# Patient Record
Sex: Male | Born: 1945 | Race: White | Hispanic: No | Marital: Married | Smoking: Former smoker
Health system: Southern US, Community
[De-identification: ages and names within clinical notes are randomized; demographics above are authoritative.]

## PROBLEM LIST (undated history)

## (undated) DIAGNOSIS — N529 Male erectile dysfunction, unspecified: Secondary | ICD-10-CM

## (undated) DIAGNOSIS — I10 Essential (primary) hypertension: Secondary | ICD-10-CM

## (undated) DIAGNOSIS — E669 Obesity, unspecified: Secondary | ICD-10-CM

## (undated) DIAGNOSIS — I700 Atherosclerosis of aorta: Secondary | ICD-10-CM

## (undated) DIAGNOSIS — R001 Bradycardia, unspecified: Secondary | ICD-10-CM

## (undated) HISTORY — DX: Atherosclerosis of aorta: I70.0

## (undated) HISTORY — DX: Obesity, unspecified: E66.9

## (undated) HISTORY — DX: Essential (primary) hypertension: I10

## (undated) HISTORY — DX: Bradycardia, unspecified: R00.1

## (undated) HISTORY — PX: FRACTURE SURGERY: SHX138

## (undated) HISTORY — DX: Male erectile dysfunction, unspecified: N52.9

## (undated) HISTORY — PX: TONSILLECTOMY: SUR1361

## (undated) HISTORY — PX: HERNIA REPAIR: SHX51

---

## 2002-06-02 HISTORY — PX: GASTRIC BYPASS: SHX52

## 2010-04-04 ENCOUNTER — Ambulatory Visit: Payer: Self-pay | Admitting: Cardiovascular Disease

## 2010-04-15 ENCOUNTER — Ambulatory Visit: Payer: Self-pay | Admitting: Cardiovascular Disease

## 2010-04-22 ENCOUNTER — Telehealth: Payer: Self-pay | Admitting: Cardiovascular Disease

## 2010-04-24 ENCOUNTER — Telehealth: Payer: Self-pay | Admitting: Cardiovascular Disease

## 2010-04-29 ENCOUNTER — Telehealth: Payer: Self-pay | Admitting: Cardiovascular Disease

## 2010-04-30 ENCOUNTER — Telehealth: Payer: Self-pay | Admitting: Cardiovascular Disease

## 2010-05-03 ENCOUNTER — Telehealth: Payer: Self-pay | Admitting: Cardiovascular Disease

## 2010-07-02 NOTE — Progress Notes (Signed)
Summary: BP readings  Phone Note Outgoing Call   Call placed by: Dessie Coma  LPN,  April 24, 2010 8:56 AM Call placed to: Patient Summary of Call: Patient notified per Dr. Kirke Corin, BP readings are much better.  To continue same medications and f/u as planned.

## 2010-07-02 NOTE — Progress Notes (Signed)
Summary: lab results.  Phone Note Outgoing Call   Call placed by: Dessie Coma  LPN,  April 29, 2010 4:06 PM Call placed to: Patient Summary of Call: Patient notified by Dr. Kirke Corin, [facility] was abnormal.  To repeat BMP at PCP.

## 2010-07-02 NOTE — Progress Notes (Signed)
Summary: bp list  Phone Note Outgoing Call   Call placed by: Dessie Coma  LPN,  April 30, 2010 2:42 PM Call placed to: Patient Summary of Call: Patient notified per Dr. Kirke Corin, list of BPs brought in by patient are the same as before and to continue current meds.

## 2010-07-02 NOTE — Progress Notes (Signed)
Summary: lab results  Phone Note Outgoing Call   Call placed by: Dessie Coma  LPN,  May 03, 2010 8:59 AM Call placed to: Patient Summary of Call: Patient notified per Dr. Kirke Corin, kidney function is better but still not back to normal.  To decrease Chlorthalidone to 1/2 tablet once daily.

## 2010-07-02 NOTE — Progress Notes (Signed)
Summary: BP readings  Phone Note Call from Patient   Caller: Patient Summary of Call: Patient stopped by office to have BP checked-BP 131/74  HR 53.  On patient's machine, BP 143/87.  Home BP readings range from 94/55 to 155/83. Initial call taken by: Dessie Coma  LPN,  April 22, 2010 3:37 PM

## 2010-07-16 ENCOUNTER — Ambulatory Visit (INDEPENDENT_AMBULATORY_CARE_PROVIDER_SITE_OTHER): Payer: Medicare PPO | Admitting: Cardiovascular Disease

## 2010-07-16 DIAGNOSIS — I10 Essential (primary) hypertension: Secondary | ICD-10-CM

## 2010-07-16 DIAGNOSIS — I498 Other specified cardiac arrhythmias: Secondary | ICD-10-CM

## 2010-07-17 NOTE — Assessment & Plan Note (Signed)
[facility]                       [place] CARDIOLOGY OFFICE NOTE  Joshua Valenzuela                        MRN:          [id number] DATE:07/16/2010                            DOB:          [birth year]   HISTORY OF PRESENT ILLNESS:  Mr. Joshua Valenzuela is a 65 year old gentleman who is here today for a followup visit.  He has the following problem list: 1. Refractory hypertension. 2. Asymptomatic sinus bradycardia. 3. Erectile dysfunction. 4. Obesity status post gastric bypass surgery.  INTERVAL HISTORY:  The patient overall has been doing very well.  During his last visit, I started him on chlorthalidone 25 mg once daily for blood pressure control.  His labs showed worsening renal function with a creatinine of 1.7.  Thus, I decreased the dose to half a tablet once daily and subsequently his creatinine improved to 1.47.  His lisinopril was also decreased the 20 mg daily from 40 mg.  His blood pressure has improved significantly since then.  He did have few readings of systolic blood pressure of less than 100.  Thus, his dose of terazosin was decreased from 5 mg to 2 mg once daily.  He brought his home blood pressure readings and they are actually mostly in the optimal range. There are only very few readings of systolic blood pressure above 462, mostly his systolic blood pressures are between 110 and 130.  He has not had any dizziness, there are no other cardiac symptoms.  MEDICATIONS: 1. Multivitamin once daily. 2. Fish oil 1000 mg once daily. 3. Lisinopril 20 mg once daily. 4. Amlodipine 10 mg once daily. 5. Chlorthalidone 25 mg half a tablet once daily. 6. Terazosin 2 mg once daily.  ALLERGIES:  No known drug allergies.  PHYSICAL EXAMINATION:  VITAL SIGNS:  Weight is 197.6 pounds, blood pressure is 149/78, pulse is 43, and oxygen saturation is 96% on room air. NECK:  No JVD or carotid bruits. LUNGS:  Clear to auscultation. HEART:  Regular rate and  rhythm with no gallops.  There is a 2/6 holosystolic murmur at the left sternal border. ABDOMEN:  Benign, nontender, and nondistended. EXTREMITIES:  No clubbing, cyanosis, or edema.  IMPRESSION: 1. Hypertension:  His blood pressure has actually been much better     controlled after the recent changes in his medications.  His home     blood pressure readings are in the optimal range.  Thus, we will     continue with current medications.  I will like to obtain a     followup basic metabolic profile within the next few weeks.  He can     have that done at Dr. Blair Dolphin office during his next visit with     him.  Request was given in that regard. 2. Asymptomatic sinus bradycardia.  This can be monitored for now. 3. Mild exertional dyspnea with negative cardiac workup including a     negative treadmill stress test as well as an echocardiogram. The patient can follow up as needed.    Joshua Bears, MD Electronically Signed   MA/MedQ  DD: 07/16/2010  DT: 07/17/2010  Job #: [id number]

## 2010-10-15 NOTE — Letter (Signed)
April 04, 2010    Joshua Valenzuela, M.D.  [facility]  [address]  [place], [place]  [address]   RE:  Joshua Valenzuela, Joshua Valenzuela  MRN:  [id number]  /  DOB:  [birth year]   Dear Dr. Orvan Falconer:   Thank you for referring Joshua Valenzuela for further cardiac evaluation.  As  you are aware, this is a pleasant 65 year old gentleman with the  following problem list:  1. Refractory hypertension.  2. Erectile dysfunction.  3. Obesity status post gastric bypass surgery.   CLINICAL HISTORY:  The patient is here today for cardiac evaluation to  see if it is safe to prescribe him an erectile dysfunction medication.  Overall, he has no previous cardiac history.  He denies any chest pain.  He has mild dyspnea but only at very high workloads.  There has been no  palpitations, syncope or presyncope.  According to the patient his heart  rate is always slow but he does not seem to be symptomatic.  There is no  dizziness.  His blood pressure has been difficult to control.  He is  currently not on a diuretic given that hydrochlorothiazide interfered  with his sexual performance.  Most recently the patient was switched to  [facility] but he has not noticed any difference in his blood pressure  control.  He has been told in the past that he has a heart murmur.   PAST MEDICAL HISTORY:  As outlined above.   MEDICATIONS:  1. Lisinopril 40 mg once daily.  2. Terazosin 5 mg once daily.  3. Tekamlo 300/10 mg once daily.  4. Multivitamin once daily.  5. Fish oil 1000 mg once daily.   ALLERGIES:  No known drug allergies.   SOCIAL HISTORY:  Negative for smoking.  He quit in 1977.  He denies any  alcohol or recreational drug use.  He drinks one cup of coffee a day.  He exercises regularly.  He is retired.   PAST SURGICAL HISTORY:  Includes gastric bypass surgery in 2004 and  hernia surgery.   FAMILY HISTORY:  Negative for premature coronary artery disease.   REVIEW OF SYSTEMS:   Remarkable for mild dyspnea, erectile dysfunction,  occasional fatigue.  A full review of system was performed and is  otherwise negative.   PHYSICAL EXAMINATION:  GENERAL:  The patient is pleasant and in no acute  distress.  VITAL SIGNS:  Weight is 190.8 pounds, blood pressure is 170/76, pulse is  45, oxygen saturation is 96% on room air.  HEENT:  Normocephalic, atraumatic.  NECK:  No JVD or carotid bruits.  RESPIRATORY:  Normal respiratory effort with no use of accessory  muscles.  Auscultation reveals normal breath sounds.  CARDIOVASCULAR:  Normal PMI.  Normal S1, S2 with no gallops.  There is a  2/6 holosystolic murmur at the apex which radiates to the left sternal  border.  ABDOMEN:  Benign, nontender, nondistended with no bruits.  EXTREMITIES:  With no clubbing, cyanosis or edema.  SKIN:  Warm and dry with no rash.  MUSCULOSKELETAL:  Normal muscle strength in the upper and lower  extremities.  PSYCHIATRIC:  He is alert, oriented x3 with normal mood and affect.   An electrocardiogram was performed which showed sinus bradycardia with a  heart rate of 45.  There was no AV block or bundle branch blocks.  QT  interval is normal.  No significant ST or T-wave changes.   IMPRESSION:  1. Mild exertional dyspnea  with history of erectile dysfunction:  We      will need to evaluate his cardiac status before prescribing      erectile dysfunction medications.  The patient does not have any      chest pain and no previous cardiac history.  We will request a      treadmill stress test for further evaluation.  2. Sinus bradycardia:  This does not seem to be symptomatic.  We will      avoid medications that can cause bradycardia.  3. Heart murmur at the apex:  We will request an echocardiogram for      further evaluation.  4. Refractory hypertension:  I asked him to start recording his blood      pressure twice daily and bring it during his follow-up.  According      to him he was evaluated  for renal artery stenosis in 2004 and at      that time it was unremarkable.  We will consider adding hydralazine      upon follow-up if blood pressure remains elevated.   Thank you for allowing me to participate in the care of your patient.    Sincerely,      Lorine Bears, MD  Electronically Signed    MA/MedQ  DD: 04/04/2010  DT: 04/04/2010  Job #: [id number]

## 2010-10-15 NOTE — Assessment & Plan Note (Signed)
[facility]                        [place] CARDIOLOGY OFFICE NOTE   CHRISTON, PARADA                        MRN:          [id number]  DATE:04/15/2010                            DOB:          07-31-1945    Joshua Valenzuela is a 65 year old gentleman who is here today for a followup  visit.  He has the following problem list:  1. Refractory hypertension.  2. Erectile dysfunction.  3. Obesity status post gastric bypass surgery.   INTERVAL HISTORY:  I evaluated Mr. Kennan recently for very mild  exertional dyspnea with baseline bradycardia.  He did not have any chest  pain.  He does have history of erectile dysfunction.  He also has a  prolonged history of refractory hypertension.  He was recently switched  to [facility].  However, he had noticed that there has been no improvement  in his blood pressure with this medication over amlodipine.  He was on  hydrochlorothiazide in the past which was stopped due to interference  with his sexual performance.   MEDICATIONS:  1. Lisinopril 40 mg once daily.  2. Terazosin 5 mg once daily.  3. Tekamlo 300/10 mg once daily.  4. Multivitamin once daily.  5. Fish oil 1000 mg once daily.   PHYSICAL EXAMINATION:  VITAL SIGNS:  Weight is 192.6 pounds, blood  pressure is 181/86, pulse is 45, oxygen saturation is 99% on room air.  NECK:  Reveals no JVD or carotid bruits.  LUNGS:  Clear to auscultation.  HEART:  Regular rate and rhythm with no gallops.  There is a 2/6  holosystolic murmur at the apex and left sternal border.  ABDOMEN:  Benign, nontender, nondistended.  EXTREMITIES:  With no clubbing, cyanosis, or edema.   IMPRESSION:  1. Mild exertional dyspnea.  He underwent a treadmill stress test      which overall was unremarkable.  He was able to exercise for 12      minutes, then achieved maximum heart rate of 146 beats per minute      which is 94% of predicted.  He had no chest pain or EKG changes at      that  workload.  Thus, the patient does not seem to have any      underlying ischemic heart disease.  There is no contraindication to      use PDE inhibitors such as Viagra or Cialis.  2. Sinus bradycardia:  This does not seem to be symptomatic.  His      heart rate increased appropriately with exercise.  There is no      indication for permanent pacemaker.  3. Heart murmur seems to be due to mild mitral regurgitation.      Ejection fraction was normal by echocardiogram.  No further workup      for this is needed.  4. Refractory hypertension:  His blood pressure continues to be      elevated.  I reviewed his home blood pressure readings which seems      to be better than his office readings, but still he gets occasional  reading of systolic of 160.  The patient was evaluated for renal      artery stenosis in the past which was negative.  He does not seem      to have symptoms suggestive of sleep apnea at this time.  He has      not seen any improvement after he was switched to [facility] and is      concerned about the ability to obtain this medication on a regular      basis.  Thus, I will go ahead and switch him back to amlodipine 10      mg once daily.  I discussed with him the next step for blood      pressure control.  I favor starting him on diuretic.  He did not      tolerate hydrochlorothiazide in the past as it interfered with his      sexual performance.  I suggest trying chlorthalidone at 25 mg once      daily which will be started today.  If he does not tolerate this      medication or it does not control his blood pressure, then the next      step would be adding hydralazine.  Unfortunately, we cannot add a      beta blocker due to his baseline bradycardia.  He will continue to      record his home blood pressure readings.  He will return for nurse      visit in a week to measure his blood pressure and compare it with      his home blood pressure machine as well.  We will request  basic      metabolic profile in 1 week.  He will follow up in 3 months from      now or earlier if needed.     Lorine Bears, MD  Electronically Signed    MA/MedQ  DD: 04/15/2010  DT: 04/16/2010  Job #: [id number]

## 2010-12-30 ENCOUNTER — Encounter: Payer: Self-pay | Admitting: Cardiovascular Disease

## 2011-02-23 ENCOUNTER — Encounter: Payer: Self-pay | Admitting: Cardiovascular Disease

## 2011-03-18 ENCOUNTER — Encounter: Payer: Self-pay | Admitting: Cardiovascular Disease

## 2011-12-03 ENCOUNTER — Ambulatory Visit: Payer: Medicare PPO | Admitting: Cardiovascular Disease

## 2012-11-10 ENCOUNTER — Emergency Department (HOSPITAL_COMMUNITY): Payer: Medicare PPO

## 2012-11-10 ENCOUNTER — Encounter (HOSPITAL_COMMUNITY): Payer: Self-pay | Admitting: Emergency Medicine

## 2012-11-10 ENCOUNTER — Emergency Department (HOSPITAL_COMMUNITY)
Admission: EM | Admit: 2012-11-10 | Discharge: 2012-11-10 | Disposition: A | Discharge status: Home / Self Care | Payer: Medicare PPO | Attending: Emergency Medicine | Admitting: Emergency Medicine

## 2012-11-10 DIAGNOSIS — IMO0002 Reserved for concepts with insufficient information to code with codable children: Secondary | ICD-10-CM | POA: Insufficient documentation

## 2012-11-10 DIAGNOSIS — S4980XA Other specified injuries of shoulder and upper arm, unspecified arm, initial encounter: Secondary | ICD-10-CM | POA: Insufficient documentation

## 2012-11-10 DIAGNOSIS — S46909A Unspecified injury of unspecified muscle, fascia and tendon at shoulder and upper arm level, unspecified arm, initial encounter: Secondary | ICD-10-CM | POA: Insufficient documentation

## 2012-11-10 DIAGNOSIS — S0180XA Unspecified open wound of other part of head, initial encounter: Secondary | ICD-10-CM | POA: Insufficient documentation

## 2012-11-10 DIAGNOSIS — S0181XA Laceration without foreign body of other part of head, initial encounter: Secondary | ICD-10-CM

## 2012-11-10 DIAGNOSIS — Z9884 Bariatric surgery status: Secondary | ICD-10-CM | POA: Insufficient documentation

## 2012-11-10 DIAGNOSIS — S52599A Other fractures of lower end of unspecified radius, initial encounter for closed fracture: Secondary | ICD-10-CM | POA: Insufficient documentation

## 2012-11-10 DIAGNOSIS — Z87891 Personal history of nicotine dependence: Secondary | ICD-10-CM | POA: Insufficient documentation

## 2012-11-10 DIAGNOSIS — Z87448 Personal history of other diseases of urinary system: Secondary | ICD-10-CM | POA: Insufficient documentation

## 2012-11-10 DIAGNOSIS — Z79899 Other long term (current) drug therapy: Secondary | ICD-10-CM | POA: Insufficient documentation

## 2012-11-10 DIAGNOSIS — S2249XA Multiple fractures of ribs, unspecified side, initial encounter for closed fracture: Secondary | ICD-10-CM | POA: Insufficient documentation

## 2012-11-10 DIAGNOSIS — R109 Unspecified abdominal pain: Secondary | ICD-10-CM | POA: Insufficient documentation

## 2012-11-10 DIAGNOSIS — Y9389 Activity, other specified: Secondary | ICD-10-CM | POA: Insufficient documentation

## 2012-11-10 DIAGNOSIS — I10 Essential (primary) hypertension: Secondary | ICD-10-CM | POA: Insufficient documentation

## 2012-11-10 DIAGNOSIS — Z8679 Personal history of other diseases of the circulatory system: Secondary | ICD-10-CM | POA: Insufficient documentation

## 2012-11-10 DIAGNOSIS — Y9289 Other specified places as the place of occurrence of the external cause: Secondary | ICD-10-CM | POA: Insufficient documentation

## 2012-11-10 DIAGNOSIS — S20219A Contusion of unspecified front wall of thorax, initial encounter: Secondary | ICD-10-CM | POA: Insufficient documentation

## 2012-11-10 DIAGNOSIS — S2241XA Multiple fractures of ribs, right side, initial encounter for closed fracture: Secondary | ICD-10-CM

## 2012-11-10 DIAGNOSIS — S52501A Unspecified fracture of the lower end of right radius, initial encounter for closed fracture: Secondary | ICD-10-CM

## 2012-11-10 LAB — CBC WITH DIFFERENTIAL/PLATELET
Basophils Relative: 0 % (ref 0–1)
Eosinophils Absolute: 0.1 10*3/uL (ref 0.0–0.7)
Hemoglobin: 12.3 g/dL — ABNORMAL LOW (ref 13.0–17.0)
Lymphs Abs: 2.3 10*3/uL (ref 0.7–4.0)
MCH: 29.1 pg (ref 26.0–34.0)
MCHC: 33.4 g/dL (ref 30.0–36.0)
Monocytes Relative: 8 % (ref 3–12)
Neutro Abs: 8.1 10*3/uL — ABNORMAL HIGH (ref 1.7–7.7)
Neutrophils Relative %: 71 % (ref 43–77)
Platelets: 301 10*3/uL (ref 150–400)
RBC: 4.23 MIL/uL (ref 4.22–5.81)

## 2012-11-10 LAB — POCT I-STAT, CHEM 8
Chloride: 109 mEq/L (ref 96–112)
HCT: 38 % — ABNORMAL LOW (ref 39.0–52.0)
Hemoglobin: 12.9 g/dL — ABNORMAL LOW (ref 13.0–17.0)
Potassium: 4.5 mEq/L (ref 3.5–5.1)
Sodium: 140 mEq/L (ref 135–145)

## 2012-11-10 MED ORDER — NAPROXEN 500 MG PO TABS: 500.0000 mg | ORAL_TABLET | Freq: Two times a day (BID) | ORAL | Status: DC

## 2012-11-10 MED ORDER — HYDROCODONE-ACETAMINOPHEN 5-325 MG PO TABS: 2.0000 | ORAL_TABLET | ORAL | Status: DC | PRN

## 2012-11-10 MED ORDER — IOHEXOL 300 MG/ML  SOLN
100.0000 mL | Freq: Once | INTRAMUSCULAR | Status: AC | PRN
  Administered 2012-11-10: 100 mL via INTRAVENOUS

## 2012-11-10 MED ORDER — MORPHINE SULFATE 4 MG/ML IJ SOLN
2.0000 mg | Freq: Once | INTRAMUSCULAR | Status: AC
  Administered 2012-11-10: 2 mg via INTRAVENOUS
  Filled 2012-11-10: qty 1

## 2012-11-10 NOTE — ED Notes (Signed)
Dr. Hyacinth Meeker made aware of BP after ambulation.  Stated that if pt still wanted to go home he could.  Pt asked what he wanted to do.  Pt stated that he would like to go home.  Dispo finished on pt.

## 2012-11-10 NOTE — ED Notes (Signed)
Florentina Jenny, PA with trauma at the bedside. Pt rather would sit in the chair than in the stretcher.
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
## 2012-11-10 NOTE — ED Notes (Addendum)
Miller made aware of BP; Miller in to talk to pt; pt sts he would like to go home as he does not want to stay; Hyacinth Meeker talked pt into allowing [place] to ambulate pt and see if that helps his BP; gave verbal to ambulate and recheck pressure.

## 2012-11-10 NOTE — Progress Notes (Signed)
Orthopedic Tech Progress Note Patient Details:  Joshua Valenzuela 1946/03/12 [id number]  Ortho Devices Type of Ortho Device: Ace wrap;Thumb spica splint;Volar splint Splint Material: Plaster Ortho Device/Splint Interventions: Ordered;Application   [name] 11/10/2012, 3:36 PM

## 2012-11-10 NOTE — ED Notes (Signed)
Pt back on stretcher, rates pain a 6/10, asked if he would like to try something for pain. Pt agreed.
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
## 2015-01-03 DIAGNOSIS — Z1389 Encounter for screening for other disorder: Secondary | ICD-10-CM | POA: Diagnosis not present

## 2015-01-03 DIAGNOSIS — Z13220 Encounter for screening for lipoid disorders: Secondary | ICD-10-CM | POA: Diagnosis not present

## 2015-01-03 DIAGNOSIS — Z6827 Body mass index (BMI) 27.0-27.9, adult: Secondary | ICD-10-CM | POA: Diagnosis not present

## 2015-01-03 DIAGNOSIS — Z0000 Encounter for general adult medical examination without abnormal findings: Secondary | ICD-10-CM | POA: Diagnosis not present

## 2015-01-03 DIAGNOSIS — Z125 Encounter for screening for malignant neoplasm of prostate: Secondary | ICD-10-CM | POA: Diagnosis not present

## 2015-01-03 DIAGNOSIS — Z131 Encounter for screening for diabetes mellitus: Secondary | ICD-10-CM | POA: Diagnosis not present

## 2016-01-07 DIAGNOSIS — Z13220 Encounter for screening for lipoid disorders: Secondary | ICD-10-CM | POA: Diagnosis not present

## 2016-01-07 DIAGNOSIS — Z79899 Other long term (current) drug therapy: Secondary | ICD-10-CM | POA: Diagnosis not present

## 2016-01-07 DIAGNOSIS — Z1389 Encounter for screening for other disorder: Secondary | ICD-10-CM | POA: Diagnosis not present

## 2016-01-07 DIAGNOSIS — Z0000 Encounter for general adult medical examination without abnormal findings: Secondary | ICD-10-CM | POA: Diagnosis not present

## 2016-01-07 DIAGNOSIS — Z6827 Body mass index (BMI) 27.0-27.9, adult: Secondary | ICD-10-CM | POA: Diagnosis not present

## 2016-01-07 DIAGNOSIS — I10 Essential (primary) hypertension: Secondary | ICD-10-CM | POA: Diagnosis not present

## 2016-01-07 DIAGNOSIS — Z9181 History of falling: Secondary | ICD-10-CM | POA: Diagnosis not present

## 2016-01-07 DIAGNOSIS — Z125 Encounter for screening for malignant neoplasm of prostate: Secondary | ICD-10-CM | POA: Diagnosis not present

## 2016-01-07 DIAGNOSIS — Z131 Encounter for screening for diabetes mellitus: Secondary | ICD-10-CM | POA: Diagnosis not present

## 2016-02-05 DIAGNOSIS — N183 Chronic kidney disease, stage 3 (moderate): Secondary | ICD-10-CM | POA: Diagnosis not present

## 2016-11-04 DIAGNOSIS — M160 Bilateral primary osteoarthritis of hip: Secondary | ICD-10-CM | POA: Diagnosis not present

## 2016-11-04 DIAGNOSIS — N281 Cyst of kidney, acquired: Secondary | ICD-10-CM | POA: Diagnosis not present

## 2016-11-04 DIAGNOSIS — K5900 Constipation, unspecified: Secondary | ICD-10-CM | POA: Diagnosis not present

## 2016-11-04 DIAGNOSIS — M542 Cervicalgia: Secondary | ICD-10-CM | POA: Diagnosis not present

## 2016-11-04 DIAGNOSIS — S82452D Displaced comminuted fracture of shaft of left fibula, subsequent encounter for closed fracture with routine healing: Secondary | ICD-10-CM | POA: Diagnosis not present

## 2016-11-04 DIAGNOSIS — S199XXA Unspecified injury of neck, initial encounter: Secondary | ICD-10-CM | POA: Diagnosis not present

## 2016-11-04 DIAGNOSIS — Z043 Encounter for examination and observation following other accident: Secondary | ICD-10-CM | POA: Diagnosis not present

## 2016-11-04 DIAGNOSIS — S82252A Displaced comminuted fracture of shaft of left tibia, initial encounter for closed fracture: Secondary | ICD-10-CM | POA: Diagnosis not present

## 2016-11-04 DIAGNOSIS — S3993XA Unspecified injury of pelvis, initial encounter: Secondary | ICD-10-CM | POA: Diagnosis not present

## 2016-11-04 DIAGNOSIS — S8992XA Unspecified injury of left lower leg, initial encounter: Secondary | ICD-10-CM | POA: Diagnosis not present

## 2016-11-04 DIAGNOSIS — S89002A Unspecified physeal fracture of upper end of left tibia, initial encounter for closed fracture: Secondary | ICD-10-CM | POA: Diagnosis not present

## 2016-11-04 DIAGNOSIS — M8580 Other specified disorders of bone density and structure, unspecified site: Secondary | ICD-10-CM | POA: Diagnosis not present

## 2016-11-04 DIAGNOSIS — M4313 Spondylolisthesis, cervicothoracic region: Secondary | ICD-10-CM | POA: Diagnosis not present

## 2016-11-04 DIAGNOSIS — Q272 Other congenital malformations of renal artery: Secondary | ICD-10-CM | POA: Diagnosis not present

## 2016-11-04 DIAGNOSIS — Z6825 Body mass index (BMI) 25.0-25.9, adult: Secondary | ICD-10-CM | POA: Diagnosis not present

## 2016-11-04 DIAGNOSIS — R51 Headache: Secondary | ICD-10-CM | POA: Diagnosis not present

## 2016-11-04 DIAGNOSIS — S0990XA Unspecified injury of head, initial encounter: Secondary | ICD-10-CM | POA: Diagnosis not present

## 2016-11-04 DIAGNOSIS — I10 Essential (primary) hypertension: Secondary | ICD-10-CM | POA: Diagnosis not present

## 2016-11-04 DIAGNOSIS — S82452A Displaced comminuted fracture of shaft of left fibula, initial encounter for closed fracture: Secondary | ICD-10-CM | POA: Diagnosis not present

## 2016-11-04 DIAGNOSIS — K8689 Other specified diseases of pancreas: Secondary | ICD-10-CM | POA: Diagnosis not present

## 2016-11-04 DIAGNOSIS — I771 Stricture of artery: Secondary | ICD-10-CM | POA: Diagnosis not present

## 2016-11-04 DIAGNOSIS — K828 Other specified diseases of gallbladder: Secondary | ICD-10-CM | POA: Diagnosis not present

## 2016-11-04 DIAGNOSIS — I517 Cardiomegaly: Secondary | ICD-10-CM | POA: Diagnosis not present

## 2016-11-04 DIAGNOSIS — Z041 Encounter for examination and observation following transport accident: Secondary | ICD-10-CM | POA: Diagnosis not present

## 2016-11-04 DIAGNOSIS — M47812 Spondylosis without myelopathy or radiculopathy, cervical region: Secondary | ICD-10-CM | POA: Diagnosis not present

## 2016-11-04 DIAGNOSIS — S2241XD Multiple fractures of ribs, right side, subsequent encounter for fracture with routine healing: Secondary | ICD-10-CM | POA: Diagnosis not present

## 2016-11-04 DIAGNOSIS — M79605 Pain in left leg: Secondary | ICD-10-CM | POA: Diagnosis not present

## 2016-11-04 DIAGNOSIS — E669 Obesity, unspecified: Secondary | ICD-10-CM | POA: Diagnosis not present

## 2016-11-04 DIAGNOSIS — S82142A Displaced bicondylar fracture of left tibia, initial encounter for closed fracture: Secondary | ICD-10-CM | POA: Diagnosis not present

## 2016-11-04 DIAGNOSIS — S298XXA Other specified injuries of thorax, initial encounter: Secondary | ICD-10-CM | POA: Diagnosis not present

## 2016-11-04 DIAGNOSIS — Z9884 Bariatric surgery status: Secondary | ICD-10-CM | POA: Diagnosis not present

## 2016-11-04 DIAGNOSIS — S82192A Other fracture of upper end of left tibia, initial encounter for closed fracture: Secondary | ICD-10-CM | POA: Diagnosis not present

## 2016-11-04 DIAGNOSIS — S82462A Displaced segmental fracture of shaft of left fibula, initial encounter for closed fracture: Secondary | ICD-10-CM | POA: Diagnosis not present

## 2016-11-04 DIAGNOSIS — S82222D Displaced transverse fracture of shaft of left tibia, subsequent encounter for closed fracture with routine healing: Secondary | ICD-10-CM | POA: Diagnosis not present

## 2016-11-06 DIAGNOSIS — S82142A Displaced bicondylar fracture of left tibia, initial encounter for closed fracture: Secondary | ICD-10-CM

## 2016-11-06 HISTORY — DX: Displaced bicondylar fracture of left tibia, initial encounter for closed fracture: S82.142A

## 2016-11-07 ENCOUNTER — Other Ambulatory Visit: Payer: Self-pay | Admitting: *Deleted
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
## 2016-11-10 ENCOUNTER — Encounter: Payer: Self-pay | Admitting: *Deleted

## 2016-11-10 DIAGNOSIS — S82142D Displaced bicondylar fracture of left tibia, subsequent encounter for closed fracture with routine healing: Secondary | ICD-10-CM | POA: Diagnosis not present

## 2016-11-10 DIAGNOSIS — G8918 Other acute postprocedural pain: Secondary | ICD-10-CM | POA: Diagnosis not present

## 2016-11-10 DIAGNOSIS — I10 Essential (primary) hypertension: Secondary | ICD-10-CM | POA: Diagnosis not present

## 2016-11-10 DIAGNOSIS — S82142A Displaced bicondylar fracture of left tibia, initial encounter for closed fracture: Secondary | ICD-10-CM | POA: Diagnosis not present

## 2016-11-10 DIAGNOSIS — S99912A Unspecified injury of left ankle, initial encounter: Secondary | ICD-10-CM | POA: Diagnosis not present

## 2016-11-13 DIAGNOSIS — Z7982 Long term (current) use of aspirin: Secondary | ICD-10-CM | POA: Diagnosis not present

## 2016-11-13 DIAGNOSIS — Z9181 History of falling: Secondary | ICD-10-CM | POA: Diagnosis not present

## 2016-11-13 DIAGNOSIS — S82142D Displaced bicondylar fracture of left tibia, subsequent encounter for closed fracture with routine healing: Secondary | ICD-10-CM | POA: Diagnosis not present

## 2016-11-13 DIAGNOSIS — I10 Essential (primary) hypertension: Secondary | ICD-10-CM | POA: Diagnosis not present

## 2016-11-15 DIAGNOSIS — S82142D Displaced bicondylar fracture of left tibia, subsequent encounter for closed fracture with routine healing: Secondary | ICD-10-CM | POA: Diagnosis not present

## 2016-11-15 DIAGNOSIS — Z7982 Long term (current) use of aspirin: Secondary | ICD-10-CM | POA: Diagnosis not present

## 2016-11-15 DIAGNOSIS — I10 Essential (primary) hypertension: Secondary | ICD-10-CM | POA: Diagnosis not present

## 2016-11-15 DIAGNOSIS — Z9181 History of falling: Secondary | ICD-10-CM | POA: Diagnosis not present

## 2016-11-16 DIAGNOSIS — Z9181 History of falling: Secondary | ICD-10-CM | POA: Diagnosis not present

## 2016-11-16 DIAGNOSIS — S82142D Displaced bicondylar fracture of left tibia, subsequent encounter for closed fracture with routine healing: Secondary | ICD-10-CM | POA: Diagnosis not present

## 2016-11-16 DIAGNOSIS — I10 Essential (primary) hypertension: Secondary | ICD-10-CM | POA: Diagnosis not present

## 2016-11-16 DIAGNOSIS — Z7982 Long term (current) use of aspirin: Secondary | ICD-10-CM | POA: Diagnosis not present

## 2016-11-18 DIAGNOSIS — Z7982 Long term (current) use of aspirin: Secondary | ICD-10-CM | POA: Diagnosis not present

## 2016-11-18 DIAGNOSIS — Z9181 History of falling: Secondary | ICD-10-CM | POA: Diagnosis not present

## 2016-11-18 DIAGNOSIS — S82142D Displaced bicondylar fracture of left tibia, subsequent encounter for closed fracture with routine healing: Secondary | ICD-10-CM | POA: Diagnosis not present

## 2016-11-18 DIAGNOSIS — I10 Essential (primary) hypertension: Secondary | ICD-10-CM | POA: Diagnosis not present

## 2016-11-19 DIAGNOSIS — S82142D Displaced bicondylar fracture of left tibia, subsequent encounter for closed fracture with routine healing: Secondary | ICD-10-CM | POA: Diagnosis not present

## 2016-11-19 DIAGNOSIS — Z9181 History of falling: Secondary | ICD-10-CM | POA: Diagnosis not present

## 2016-11-19 DIAGNOSIS — Z13220 Encounter for screening for lipoid disorders: Secondary | ICD-10-CM | POA: Diagnosis not present

## 2016-11-19 DIAGNOSIS — Z7982 Long term (current) use of aspirin: Secondary | ICD-10-CM | POA: Diagnosis not present

## 2016-11-19 DIAGNOSIS — I10 Essential (primary) hypertension: Secondary | ICD-10-CM | POA: Diagnosis not present

## 2016-11-19 DIAGNOSIS — Z6825 Body mass index (BMI) 25.0-25.9, adult: Secondary | ICD-10-CM | POA: Diagnosis not present

## 2016-11-19 DIAGNOSIS — S82209A Unspecified fracture of shaft of unspecified tibia, initial encounter for closed fracture: Secondary | ICD-10-CM | POA: Diagnosis not present

## 2016-11-19 DIAGNOSIS — Z139 Encounter for screening, unspecified: Secondary | ICD-10-CM | POA: Diagnosis not present

## 2016-11-19 DIAGNOSIS — Z79899 Other long term (current) drug therapy: Secondary | ICD-10-CM | POA: Diagnosis not present

## 2016-11-21 DIAGNOSIS — Z7982 Long term (current) use of aspirin: Secondary | ICD-10-CM | POA: Diagnosis not present

## 2016-11-21 DIAGNOSIS — S82142D Displaced bicondylar fracture of left tibia, subsequent encounter for closed fracture with routine healing: Secondary | ICD-10-CM | POA: Diagnosis not present

## 2016-11-21 DIAGNOSIS — I10 Essential (primary) hypertension: Secondary | ICD-10-CM | POA: Diagnosis not present

## 2016-11-21 DIAGNOSIS — Z9181 History of falling: Secondary | ICD-10-CM | POA: Diagnosis not present

## 2016-11-25 DIAGNOSIS — Z9181 History of falling: Secondary | ICD-10-CM | POA: Diagnosis not present

## 2016-11-25 DIAGNOSIS — Z7982 Long term (current) use of aspirin: Secondary | ICD-10-CM | POA: Diagnosis not present

## 2016-11-25 DIAGNOSIS — I10 Essential (primary) hypertension: Secondary | ICD-10-CM | POA: Diagnosis not present

## 2016-11-25 DIAGNOSIS — S82142D Displaced bicondylar fracture of left tibia, subsequent encounter for closed fracture with routine healing: Secondary | ICD-10-CM | POA: Diagnosis not present

## 2016-11-26 DIAGNOSIS — Z9181 History of falling: Secondary | ICD-10-CM | POA: Diagnosis not present

## 2016-11-26 DIAGNOSIS — D649 Anemia, unspecified: Secondary | ICD-10-CM | POA: Diagnosis not present

## 2016-11-26 DIAGNOSIS — Z125 Encounter for screening for malignant neoplasm of prostate: Secondary | ICD-10-CM | POA: Diagnosis not present

## 2016-11-26 DIAGNOSIS — Z1389 Encounter for screening for other disorder: Secondary | ICD-10-CM | POA: Diagnosis not present

## 2016-11-26 DIAGNOSIS — Z0000 Encounter for general adult medical examination without abnormal findings: Secondary | ICD-10-CM | POA: Diagnosis not present

## 2016-11-28 DIAGNOSIS — Z7982 Long term (current) use of aspirin: Secondary | ICD-10-CM | POA: Diagnosis not present

## 2016-11-28 DIAGNOSIS — S82142D Displaced bicondylar fracture of left tibia, subsequent encounter for closed fracture with routine healing: Secondary | ICD-10-CM | POA: Diagnosis not present

## 2016-11-28 DIAGNOSIS — I10 Essential (primary) hypertension: Secondary | ICD-10-CM | POA: Diagnosis not present

## 2016-11-28 DIAGNOSIS — Z9181 History of falling: Secondary | ICD-10-CM | POA: Diagnosis not present

## 2016-12-01 DIAGNOSIS — I10 Essential (primary) hypertension: Secondary | ICD-10-CM | POA: Diagnosis not present

## 2016-12-01 DIAGNOSIS — Z9181 History of falling: Secondary | ICD-10-CM | POA: Diagnosis not present

## 2016-12-01 DIAGNOSIS — S82142D Displaced bicondylar fracture of left tibia, subsequent encounter for closed fracture with routine healing: Secondary | ICD-10-CM | POA: Diagnosis not present

## 2016-12-01 DIAGNOSIS — Z7982 Long term (current) use of aspirin: Secondary | ICD-10-CM | POA: Diagnosis not present

## 2016-12-05 DIAGNOSIS — Z9181 History of falling: Secondary | ICD-10-CM | POA: Diagnosis not present

## 2016-12-05 DIAGNOSIS — I10 Essential (primary) hypertension: Secondary | ICD-10-CM | POA: Diagnosis not present

## 2016-12-05 DIAGNOSIS — Z7982 Long term (current) use of aspirin: Secondary | ICD-10-CM | POA: Diagnosis not present

## 2016-12-05 DIAGNOSIS — S82142D Displaced bicondylar fracture of left tibia, subsequent encounter for closed fracture with routine healing: Secondary | ICD-10-CM | POA: Diagnosis not present

## 2016-12-08 DIAGNOSIS — I10 Essential (primary) hypertension: Secondary | ICD-10-CM | POA: Diagnosis not present

## 2016-12-08 DIAGNOSIS — Z9181 History of falling: Secondary | ICD-10-CM | POA: Diagnosis not present

## 2016-12-08 DIAGNOSIS — S82142D Displaced bicondylar fracture of left tibia, subsequent encounter for closed fracture with routine healing: Secondary | ICD-10-CM | POA: Diagnosis not present

## 2016-12-08 DIAGNOSIS — Z7982 Long term (current) use of aspirin: Secondary | ICD-10-CM | POA: Diagnosis not present

## 2016-12-10 DIAGNOSIS — I10 Essential (primary) hypertension: Secondary | ICD-10-CM | POA: Diagnosis not present

## 2016-12-10 DIAGNOSIS — Z9181 History of falling: Secondary | ICD-10-CM | POA: Diagnosis not present

## 2016-12-10 DIAGNOSIS — S82142D Displaced bicondylar fracture of left tibia, subsequent encounter for closed fracture with routine healing: Secondary | ICD-10-CM | POA: Diagnosis not present

## 2016-12-10 DIAGNOSIS — Z7982 Long term (current) use of aspirin: Secondary | ICD-10-CM | POA: Diagnosis not present

## 2016-12-12 DIAGNOSIS — S82142A Displaced bicondylar fracture of left tibia, initial encounter for closed fracture: Secondary | ICD-10-CM | POA: Diagnosis not present

## 2016-12-18 DIAGNOSIS — S82452D Displaced comminuted fracture of shaft of left fibula, subsequent encounter for closed fracture with routine healing: Secondary | ICD-10-CM | POA: Diagnosis not present

## 2016-12-18 DIAGNOSIS — S82252D Displaced comminuted fracture of shaft of left tibia, subsequent encounter for closed fracture with routine healing: Secondary | ICD-10-CM | POA: Diagnosis not present

## 2016-12-18 DIAGNOSIS — S82142D Displaced bicondylar fracture of left tibia, subsequent encounter for closed fracture with routine healing: Secondary | ICD-10-CM | POA: Diagnosis not present

## 2017-01-22 DIAGNOSIS — Z967 Presence of other bone and tendon implants: Secondary | ICD-10-CM | POA: Diagnosis not present

## 2017-01-22 DIAGNOSIS — S82142D Displaced bicondylar fracture of left tibia, subsequent encounter for closed fracture with routine healing: Secondary | ICD-10-CM | POA: Diagnosis not present

## 2017-01-22 DIAGNOSIS — S82192D Other fracture of upper end of left tibia, subsequent encounter for closed fracture with routine healing: Secondary | ICD-10-CM | POA: Diagnosis not present

## 2017-01-22 DIAGNOSIS — S82252D Displaced comminuted fracture of shaft of left tibia, subsequent encounter for closed fracture with routine healing: Secondary | ICD-10-CM | POA: Diagnosis not present

## 2017-01-26 DIAGNOSIS — M62552 Muscle wasting and atrophy, not elsewhere classified, left thigh: Secondary | ICD-10-CM | POA: Diagnosis not present

## 2017-01-26 DIAGNOSIS — R2689 Other abnormalities of gait and mobility: Secondary | ICD-10-CM | POA: Diagnosis not present

## 2017-01-29 DIAGNOSIS — M62552 Muscle wasting and atrophy, not elsewhere classified, left thigh: Secondary | ICD-10-CM | POA: Diagnosis not present

## 2017-01-29 DIAGNOSIS — R2689 Other abnormalities of gait and mobility: Secondary | ICD-10-CM | POA: Diagnosis not present

## 2017-02-03 DIAGNOSIS — M62552 Muscle wasting and atrophy, not elsewhere classified, left thigh: Secondary | ICD-10-CM | POA: Diagnosis not present

## 2017-02-03 DIAGNOSIS — R2689 Other abnormalities of gait and mobility: Secondary | ICD-10-CM | POA: Diagnosis not present

## 2017-02-05 DIAGNOSIS — R2689 Other abnormalities of gait and mobility: Secondary | ICD-10-CM | POA: Diagnosis not present

## 2017-02-05 DIAGNOSIS — M62552 Muscle wasting and atrophy, not elsewhere classified, left thigh: Secondary | ICD-10-CM | POA: Diagnosis not present

## 2017-02-12 DIAGNOSIS — M62552 Muscle wasting and atrophy, not elsewhere classified, left thigh: Secondary | ICD-10-CM | POA: Diagnosis not present

## 2017-02-12 DIAGNOSIS — R2689 Other abnormalities of gait and mobility: Secondary | ICD-10-CM | POA: Diagnosis not present

## 2017-02-17 DIAGNOSIS — M62552 Muscle wasting and atrophy, not elsewhere classified, left thigh: Secondary | ICD-10-CM | POA: Diagnosis not present

## 2017-02-17 DIAGNOSIS — R2689 Other abnormalities of gait and mobility: Secondary | ICD-10-CM | POA: Diagnosis not present

## 2017-02-19 DIAGNOSIS — M62552 Muscle wasting and atrophy, not elsewhere classified, left thigh: Secondary | ICD-10-CM | POA: Diagnosis not present

## 2017-02-19 DIAGNOSIS — R2689 Other abnormalities of gait and mobility: Secondary | ICD-10-CM | POA: Diagnosis not present

## 2017-02-24 DIAGNOSIS — M62552 Muscle wasting and atrophy, not elsewhere classified, left thigh: Secondary | ICD-10-CM | POA: Diagnosis not present

## 2017-02-24 DIAGNOSIS — R2689 Other abnormalities of gait and mobility: Secondary | ICD-10-CM | POA: Diagnosis not present

## 2017-02-26 DIAGNOSIS — M62552 Muscle wasting and atrophy, not elsewhere classified, left thigh: Secondary | ICD-10-CM | POA: Diagnosis not present

## 2017-02-26 DIAGNOSIS — R2689 Other abnormalities of gait and mobility: Secondary | ICD-10-CM | POA: Diagnosis not present

## 2017-03-02 DIAGNOSIS — M62552 Muscle wasting and atrophy, not elsewhere classified, left thigh: Secondary | ICD-10-CM | POA: Diagnosis not present

## 2017-03-02 DIAGNOSIS — R2689 Other abnormalities of gait and mobility: Secondary | ICD-10-CM | POA: Diagnosis not present

## 2017-03-04 DIAGNOSIS — R2689 Other abnormalities of gait and mobility: Secondary | ICD-10-CM | POA: Diagnosis not present

## 2017-03-04 DIAGNOSIS — M62552 Muscle wasting and atrophy, not elsewhere classified, left thigh: Secondary | ICD-10-CM | POA: Diagnosis not present

## 2017-03-10 DIAGNOSIS — R2689 Other abnormalities of gait and mobility: Secondary | ICD-10-CM | POA: Diagnosis not present

## 2017-03-10 DIAGNOSIS — M62552 Muscle wasting and atrophy, not elsewhere classified, left thigh: Secondary | ICD-10-CM | POA: Diagnosis not present

## 2017-03-12 DIAGNOSIS — M62552 Muscle wasting and atrophy, not elsewhere classified, left thigh: Secondary | ICD-10-CM | POA: Diagnosis not present

## 2017-03-12 DIAGNOSIS — R2689 Other abnormalities of gait and mobility: Secondary | ICD-10-CM | POA: Diagnosis not present

## 2017-03-17 DIAGNOSIS — R2689 Other abnormalities of gait and mobility: Secondary | ICD-10-CM | POA: Diagnosis not present

## 2017-03-17 DIAGNOSIS — M62552 Muscle wasting and atrophy, not elsewhere classified, left thigh: Secondary | ICD-10-CM | POA: Diagnosis not present

## 2017-03-19 DIAGNOSIS — M62552 Muscle wasting and atrophy, not elsewhere classified, left thigh: Secondary | ICD-10-CM | POA: Diagnosis not present

## 2017-03-19 DIAGNOSIS — R2689 Other abnormalities of gait and mobility: Secondary | ICD-10-CM | POA: Diagnosis not present

## 2017-03-24 DIAGNOSIS — M62552 Muscle wasting and atrophy, not elsewhere classified, left thigh: Secondary | ICD-10-CM | POA: Diagnosis not present

## 2017-03-24 DIAGNOSIS — R2689 Other abnormalities of gait and mobility: Secondary | ICD-10-CM | POA: Diagnosis not present

## 2017-03-26 DIAGNOSIS — M62552 Muscle wasting and atrophy, not elsewhere classified, left thigh: Secondary | ICD-10-CM | POA: Diagnosis not present

## 2017-03-26 DIAGNOSIS — R2689 Other abnormalities of gait and mobility: Secondary | ICD-10-CM | POA: Diagnosis not present

## 2017-03-31 DIAGNOSIS — R2689 Other abnormalities of gait and mobility: Secondary | ICD-10-CM | POA: Diagnosis not present

## 2017-03-31 DIAGNOSIS — M62552 Muscle wasting and atrophy, not elsewhere classified, left thigh: Secondary | ICD-10-CM | POA: Diagnosis not present

## 2017-04-02 DIAGNOSIS — R2689 Other abnormalities of gait and mobility: Secondary | ICD-10-CM | POA: Diagnosis not present

## 2017-04-02 DIAGNOSIS — M62552 Muscle wasting and atrophy, not elsewhere classified, left thigh: Secondary | ICD-10-CM | POA: Diagnosis not present

## 2017-04-07 DIAGNOSIS — R2689 Other abnormalities of gait and mobility: Secondary | ICD-10-CM | POA: Diagnosis not present

## 2017-04-07 DIAGNOSIS — M62552 Muscle wasting and atrophy, not elsewhere classified, left thigh: Secondary | ICD-10-CM | POA: Diagnosis not present

## 2017-04-09 DIAGNOSIS — M62552 Muscle wasting and atrophy, not elsewhere classified, left thigh: Secondary | ICD-10-CM | POA: Diagnosis not present

## 2017-04-09 DIAGNOSIS — R2689 Other abnormalities of gait and mobility: Secondary | ICD-10-CM | POA: Diagnosis not present

## 2017-04-14 DIAGNOSIS — M62552 Muscle wasting and atrophy, not elsewhere classified, left thigh: Secondary | ICD-10-CM | POA: Diagnosis not present

## 2017-04-14 DIAGNOSIS — R2689 Other abnormalities of gait and mobility: Secondary | ICD-10-CM | POA: Diagnosis not present

## 2017-04-20 DIAGNOSIS — Z6825 Body mass index (BMI) 25.0-25.9, adult: Secondary | ICD-10-CM | POA: Diagnosis not present

## 2017-04-20 DIAGNOSIS — I10 Essential (primary) hypertension: Secondary | ICD-10-CM | POA: Diagnosis not present

## 2017-04-20 DIAGNOSIS — N400 Enlarged prostate without lower urinary tract symptoms: Secondary | ICD-10-CM | POA: Diagnosis not present

## 2017-04-20 DIAGNOSIS — F411 Generalized anxiety disorder: Secondary | ICD-10-CM | POA: Diagnosis not present

## 2017-04-20 DIAGNOSIS — Z79899 Other long term (current) drug therapy: Secondary | ICD-10-CM | POA: Diagnosis not present

## 2017-04-21 DIAGNOSIS — R2689 Other abnormalities of gait and mobility: Secondary | ICD-10-CM | POA: Diagnosis not present

## 2017-04-21 DIAGNOSIS — M62552 Muscle wasting and atrophy, not elsewhere classified, left thigh: Secondary | ICD-10-CM | POA: Diagnosis not present

## 2017-04-28 DIAGNOSIS — R2689 Other abnormalities of gait and mobility: Secondary | ICD-10-CM | POA: Diagnosis not present

## 2017-04-28 DIAGNOSIS — M62552 Muscle wasting and atrophy, not elsewhere classified, left thigh: Secondary | ICD-10-CM | POA: Diagnosis not present

## 2017-05-05 DIAGNOSIS — M62552 Muscle wasting and atrophy, not elsewhere classified, left thigh: Secondary | ICD-10-CM | POA: Diagnosis not present

## 2017-05-05 DIAGNOSIS — R2689 Other abnormalities of gait and mobility: Secondary | ICD-10-CM | POA: Diagnosis not present

## 2017-05-21 DIAGNOSIS — I10 Essential (primary) hypertension: Secondary | ICD-10-CM | POA: Diagnosis not present

## 2017-05-21 DIAGNOSIS — E663 Overweight: Secondary | ICD-10-CM | POA: Diagnosis not present

## 2017-05-21 DIAGNOSIS — N529 Male erectile dysfunction, unspecified: Secondary | ICD-10-CM | POA: Diagnosis not present

## 2017-05-21 DIAGNOSIS — Z6825 Body mass index (BMI) 25.0-25.9, adult: Secondary | ICD-10-CM | POA: Diagnosis not present

## 2017-05-21 DIAGNOSIS — N400 Enlarged prostate without lower urinary tract symptoms: Secondary | ICD-10-CM | POA: Diagnosis not present

## 2017-05-21 DIAGNOSIS — F411 Generalized anxiety disorder: Secondary | ICD-10-CM | POA: Diagnosis not present

## 2017-05-21 DIAGNOSIS — Z79899 Other long term (current) drug therapy: Secondary | ICD-10-CM | POA: Diagnosis not present

## 2017-11-23 DIAGNOSIS — N529 Male erectile dysfunction, unspecified: Secondary | ICD-10-CM | POA: Diagnosis not present

## 2017-11-23 DIAGNOSIS — E663 Overweight: Secondary | ICD-10-CM | POA: Diagnosis not present

## 2017-11-23 DIAGNOSIS — N401 Enlarged prostate with lower urinary tract symptoms: Secondary | ICD-10-CM | POA: Diagnosis not present

## 2017-11-23 DIAGNOSIS — Z1339 Encounter for screening examination for other mental health and behavioral disorders: Secondary | ICD-10-CM | POA: Diagnosis not present

## 2017-11-23 DIAGNOSIS — Z79899 Other long term (current) drug therapy: Secondary | ICD-10-CM | POA: Diagnosis not present

## 2017-11-23 DIAGNOSIS — I10 Essential (primary) hypertension: Secondary | ICD-10-CM | POA: Diagnosis not present

## 2017-11-23 DIAGNOSIS — F411 Generalized anxiety disorder: Secondary | ICD-10-CM | POA: Diagnosis not present

## 2017-11-23 DIAGNOSIS — Z6825 Body mass index (BMI) 25.0-25.9, adult: Secondary | ICD-10-CM | POA: Diagnosis not present

## 2017-12-07 DIAGNOSIS — N390 Urinary tract infection, site not specified: Secondary | ICD-10-CM | POA: Diagnosis not present

## 2017-12-07 DIAGNOSIS — N5201 Erectile dysfunction due to arterial insufficiency: Secondary | ICD-10-CM

## 2017-12-07 DIAGNOSIS — D699 Hemorrhagic condition, unspecified: Secondary | ICD-10-CM | POA: Diagnosis not present

## 2017-12-07 HISTORY — DX: Erectile dysfunction due to arterial insufficiency: N52.01

## 2018-01-27 DIAGNOSIS — Z8719 Personal history of other diseases of the digestive system: Secondary | ICD-10-CM | POA: Diagnosis not present

## 2018-01-27 DIAGNOSIS — Z87891 Personal history of nicotine dependence: Secondary | ICD-10-CM | POA: Diagnosis not present

## 2018-01-27 DIAGNOSIS — D689 Coagulation defect, unspecified: Secondary | ICD-10-CM | POA: Diagnosis not present

## 2018-01-27 DIAGNOSIS — N390 Urinary tract infection, site not specified: Secondary | ICD-10-CM | POA: Diagnosis not present

## 2018-01-27 DIAGNOSIS — N5201 Erectile dysfunction due to arterial insufficiency: Secondary | ICD-10-CM | POA: Diagnosis not present

## 2018-01-27 DIAGNOSIS — Z79899 Other long term (current) drug therapy: Secondary | ICD-10-CM | POA: Diagnosis not present

## 2018-01-27 DIAGNOSIS — Z9889 Other specified postprocedural states: Secondary | ICD-10-CM | POA: Diagnosis not present

## 2018-01-27 DIAGNOSIS — I10 Essential (primary) hypertension: Secondary | ICD-10-CM | POA: Diagnosis not present

## 2018-01-27 DIAGNOSIS — T83410A Breakdown (mechanical) of penile (implanted) prosthesis, initial encounter: Secondary | ICD-10-CM | POA: Diagnosis not present

## 2018-01-27 HISTORY — PX: PENILE PROSTHESIS PLACEMENT: SHX739

## 2018-01-28 DIAGNOSIS — D689 Coagulation defect, unspecified: Secondary | ICD-10-CM | POA: Diagnosis not present

## 2018-01-28 DIAGNOSIS — I10 Essential (primary) hypertension: Secondary | ICD-10-CM | POA: Diagnosis not present

## 2018-01-28 DIAGNOSIS — Z9889 Other specified postprocedural states: Secondary | ICD-10-CM | POA: Diagnosis not present

## 2018-01-28 DIAGNOSIS — N390 Urinary tract infection, site not specified: Secondary | ICD-10-CM | POA: Diagnosis not present

## 2018-01-28 DIAGNOSIS — Z87891 Personal history of nicotine dependence: Secondary | ICD-10-CM | POA: Diagnosis not present

## 2018-01-28 DIAGNOSIS — Z79899 Other long term (current) drug therapy: Secondary | ICD-10-CM | POA: Diagnosis not present

## 2018-01-28 DIAGNOSIS — N5201 Erectile dysfunction due to arterial insufficiency: Secondary | ICD-10-CM | POA: Diagnosis not present

## 2018-01-28 DIAGNOSIS — Z8719 Personal history of other diseases of the digestive system: Secondary | ICD-10-CM | POA: Diagnosis not present

## 2018-01-29 DIAGNOSIS — Z87898 Personal history of other specified conditions: Secondary | ICD-10-CM | POA: Insufficient documentation

## 2018-01-29 DIAGNOSIS — N5201 Erectile dysfunction due to arterial insufficiency: Secondary | ICD-10-CM | POA: Diagnosis not present

## 2018-01-29 HISTORY — DX: Personal history of other specified conditions: Z87.898

## 2018-05-24 DIAGNOSIS — Z1331 Encounter for screening for depression: Secondary | ICD-10-CM | POA: Diagnosis not present

## 2018-05-24 DIAGNOSIS — Z79899 Other long term (current) drug therapy: Secondary | ICD-10-CM | POA: Diagnosis not present

## 2018-05-24 DIAGNOSIS — I10 Essential (primary) hypertension: Secondary | ICD-10-CM | POA: Diagnosis not present

## 2018-05-24 DIAGNOSIS — Z6824 Body mass index (BMI) 24.0-24.9, adult: Secondary | ICD-10-CM | POA: Diagnosis not present

## 2018-05-24 DIAGNOSIS — Z9181 History of falling: Secondary | ICD-10-CM | POA: Diagnosis not present

## 2018-05-24 DIAGNOSIS — F411 Generalized anxiety disorder: Secondary | ICD-10-CM | POA: Diagnosis not present

## 2018-05-24 DIAGNOSIS — N529 Male erectile dysfunction, unspecified: Secondary | ICD-10-CM | POA: Diagnosis not present

## 2018-05-24 DIAGNOSIS — N400 Enlarged prostate without lower urinary tract symptoms: Secondary | ICD-10-CM | POA: Diagnosis not present

## 2018-06-25 DIAGNOSIS — R351 Nocturia: Secondary | ICD-10-CM | POA: Diagnosis not present

## 2018-06-25 DIAGNOSIS — N401 Enlarged prostate with lower urinary tract symptoms: Secondary | ICD-10-CM | POA: Diagnosis not present

## 2018-06-25 DIAGNOSIS — N3000 Acute cystitis without hematuria: Secondary | ICD-10-CM | POA: Diagnosis not present

## 2018-06-25 DIAGNOSIS — N318 Other neuromuscular dysfunction of bladder: Secondary | ICD-10-CM | POA: Diagnosis not present

## 2018-06-28 DIAGNOSIS — N401 Enlarged prostate with lower urinary tract symptoms: Secondary | ICD-10-CM | POA: Diagnosis not present

## 2018-06-28 DIAGNOSIS — R351 Nocturia: Secondary | ICD-10-CM | POA: Diagnosis not present

## 2018-07-01 DIAGNOSIS — N3000 Acute cystitis without hematuria: Secondary | ICD-10-CM | POA: Diagnosis not present

## 2018-07-01 DIAGNOSIS — N401 Enlarged prostate with lower urinary tract symptoms: Secondary | ICD-10-CM | POA: Diagnosis not present

## 2018-07-01 DIAGNOSIS — N3090 Cystitis, unspecified without hematuria: Secondary | ICD-10-CM | POA: Diagnosis not present

## 2018-07-01 DIAGNOSIS — R351 Nocturia: Secondary | ICD-10-CM | POA: Diagnosis not present

## 2018-07-05 DIAGNOSIS — R351 Nocturia: Secondary | ICD-10-CM | POA: Diagnosis not present

## 2018-07-05 DIAGNOSIS — N401 Enlarged prostate with lower urinary tract symptoms: Secondary | ICD-10-CM | POA: Diagnosis not present

## 2018-07-05 DIAGNOSIS — N3000 Acute cystitis without hematuria: Secondary | ICD-10-CM | POA: Diagnosis not present

## 2018-07-20 DIAGNOSIS — N3020 Other chronic cystitis without hematuria: Secondary | ICD-10-CM | POA: Diagnosis not present

## 2018-07-20 DIAGNOSIS — N401 Enlarged prostate with lower urinary tract symptoms: Secondary | ICD-10-CM | POA: Diagnosis not present

## 2018-07-20 DIAGNOSIS — N318 Other neuromuscular dysfunction of bladder: Secondary | ICD-10-CM | POA: Diagnosis not present

## 2018-07-20 DIAGNOSIS — R351 Nocturia: Secondary | ICD-10-CM | POA: Diagnosis not present

## 2018-11-23 DIAGNOSIS — N529 Male erectile dysfunction, unspecified: Secondary | ICD-10-CM | POA: Diagnosis not present

## 2018-11-23 DIAGNOSIS — Z125 Encounter for screening for malignant neoplasm of prostate: Secondary | ICD-10-CM | POA: Diagnosis not present

## 2018-11-23 DIAGNOSIS — N400 Enlarged prostate without lower urinary tract symptoms: Secondary | ICD-10-CM | POA: Diagnosis not present

## 2018-11-23 DIAGNOSIS — Z6824 Body mass index (BMI) 24.0-24.9, adult: Secondary | ICD-10-CM | POA: Diagnosis not present

## 2018-11-23 DIAGNOSIS — Z79899 Other long term (current) drug therapy: Secondary | ICD-10-CM | POA: Diagnosis not present

## 2018-11-23 DIAGNOSIS — F411 Generalized anxiety disorder: Secondary | ICD-10-CM | POA: Diagnosis not present

## 2018-11-23 DIAGNOSIS — I10 Essential (primary) hypertension: Secondary | ICD-10-CM | POA: Diagnosis not present

## 2018-11-23 DIAGNOSIS — Z131 Encounter for screening for diabetes mellitus: Secondary | ICD-10-CM | POA: Diagnosis not present

## 2018-12-06 DIAGNOSIS — R972 Elevated prostate specific antigen [PSA]: Secondary | ICD-10-CM | POA: Diagnosis not present

## 2018-12-24 DIAGNOSIS — Z1331 Encounter for screening for depression: Secondary | ICD-10-CM | POA: Diagnosis not present

## 2018-12-24 DIAGNOSIS — Z0000 Encounter for general adult medical examination without abnormal findings: Secondary | ICD-10-CM | POA: Diagnosis not present

## 2018-12-24 DIAGNOSIS — Z125 Encounter for screening for malignant neoplasm of prostate: Secondary | ICD-10-CM | POA: Diagnosis not present

## 2018-12-24 DIAGNOSIS — E785 Hyperlipidemia, unspecified: Secondary | ICD-10-CM | POA: Diagnosis not present

## 2018-12-24 DIAGNOSIS — Z9181 History of falling: Secondary | ICD-10-CM | POA: Diagnosis not present

## 2018-12-24 DIAGNOSIS — Z139 Encounter for screening, unspecified: Secondary | ICD-10-CM | POA: Diagnosis not present

## 2019-05-25 DIAGNOSIS — R972 Elevated prostate specific antigen [PSA]: Secondary | ICD-10-CM | POA: Diagnosis not present

## 2019-05-25 DIAGNOSIS — F411 Generalized anxiety disorder: Secondary | ICD-10-CM | POA: Diagnosis not present

## 2019-05-25 DIAGNOSIS — N400 Enlarged prostate without lower urinary tract symptoms: Secondary | ICD-10-CM | POA: Diagnosis not present

## 2019-05-25 DIAGNOSIS — I10 Essential (primary) hypertension: Secondary | ICD-10-CM | POA: Diagnosis not present

## 2019-05-25 DIAGNOSIS — Z6824 Body mass index (BMI) 24.0-24.9, adult: Secondary | ICD-10-CM | POA: Diagnosis not present

## 2019-05-25 DIAGNOSIS — N529 Male erectile dysfunction, unspecified: Secondary | ICD-10-CM | POA: Diagnosis not present

## 2019-05-25 DIAGNOSIS — Z79899 Other long term (current) drug therapy: Secondary | ICD-10-CM | POA: Diagnosis not present

## 2019-05-25 DIAGNOSIS — E785 Hyperlipidemia, unspecified: Secondary | ICD-10-CM | POA: Diagnosis not present

## 2019-11-24 DIAGNOSIS — F411 Generalized anxiety disorder: Secondary | ICD-10-CM | POA: Diagnosis not present

## 2019-11-24 DIAGNOSIS — N529 Male erectile dysfunction, unspecified: Secondary | ICD-10-CM | POA: Diagnosis not present

## 2019-11-24 DIAGNOSIS — R972 Elevated prostate specific antigen [PSA]: Secondary | ICD-10-CM | POA: Diagnosis not present

## 2019-11-24 DIAGNOSIS — I10 Essential (primary) hypertension: Secondary | ICD-10-CM | POA: Diagnosis not present

## 2019-11-24 DIAGNOSIS — Z79899 Other long term (current) drug therapy: Secondary | ICD-10-CM | POA: Diagnosis not present

## 2019-11-24 DIAGNOSIS — E785 Hyperlipidemia, unspecified: Secondary | ICD-10-CM | POA: Diagnosis not present

## 2019-11-24 DIAGNOSIS — N400 Enlarged prostate without lower urinary tract symptoms: Secondary | ICD-10-CM | POA: Diagnosis not present

## 2019-11-24 DIAGNOSIS — I2721 Secondary pulmonary arterial hypertension: Secondary | ICD-10-CM | POA: Diagnosis not present

## 2019-11-24 DIAGNOSIS — Z6823 Body mass index (BMI) 23.0-23.9, adult: Secondary | ICD-10-CM | POA: Diagnosis not present

## 2020-05-28 DIAGNOSIS — Z79899 Other long term (current) drug therapy: Secondary | ICD-10-CM | POA: Diagnosis not present

## 2020-05-28 DIAGNOSIS — N400 Enlarged prostate without lower urinary tract symptoms: Secondary | ICD-10-CM | POA: Diagnosis not present

## 2020-05-28 DIAGNOSIS — E785 Hyperlipidemia, unspecified: Secondary | ICD-10-CM | POA: Diagnosis not present

## 2020-05-28 DIAGNOSIS — N529 Male erectile dysfunction, unspecified: Secondary | ICD-10-CM | POA: Diagnosis not present

## 2020-05-28 DIAGNOSIS — D649 Anemia, unspecified: Secondary | ICD-10-CM | POA: Diagnosis not present

## 2020-05-28 DIAGNOSIS — R972 Elevated prostate specific antigen [PSA]: Secondary | ICD-10-CM | POA: Diagnosis not present

## 2020-05-28 DIAGNOSIS — Z6825 Body mass index (BMI) 25.0-25.9, adult: Secondary | ICD-10-CM | POA: Diagnosis not present

## 2020-05-28 DIAGNOSIS — I2721 Secondary pulmonary arterial hypertension: Secondary | ICD-10-CM | POA: Diagnosis not present

## 2020-05-28 DIAGNOSIS — I10 Essential (primary) hypertension: Secondary | ICD-10-CM | POA: Diagnosis not present

## 2020-05-28 DIAGNOSIS — F411 Generalized anxiety disorder: Secondary | ICD-10-CM | POA: Diagnosis not present

## 2020-05-29 DIAGNOSIS — Z9181 History of falling: Secondary | ICD-10-CM | POA: Diagnosis not present

## 2020-05-29 DIAGNOSIS — Z0000 Encounter for general adult medical examination without abnormal findings: Secondary | ICD-10-CM | POA: Diagnosis not present

## 2020-05-29 DIAGNOSIS — Z1331 Encounter for screening for depression: Secondary | ICD-10-CM | POA: Diagnosis not present

## 2020-08-06 DIAGNOSIS — Z6824 Body mass index (BMI) 24.0-24.9, adult: Secondary | ICD-10-CM | POA: Diagnosis not present

## 2020-08-06 DIAGNOSIS — F411 Generalized anxiety disorder: Secondary | ICD-10-CM | POA: Diagnosis not present

## 2020-08-06 DIAGNOSIS — I10 Essential (primary) hypertension: Secondary | ICD-10-CM | POA: Diagnosis not present

## 2020-08-06 DIAGNOSIS — N400 Enlarged prostate without lower urinary tract symptoms: Secondary | ICD-10-CM | POA: Diagnosis not present

## 2020-08-06 DIAGNOSIS — Z79899 Other long term (current) drug therapy: Secondary | ICD-10-CM | POA: Diagnosis not present

## 2020-08-06 DIAGNOSIS — N529 Male erectile dysfunction, unspecified: Secondary | ICD-10-CM | POA: Diagnosis not present

## 2020-08-06 DIAGNOSIS — I2721 Secondary pulmonary arterial hypertension: Secondary | ICD-10-CM | POA: Diagnosis not present

## 2020-11-26 DIAGNOSIS — I10 Essential (primary) hypertension: Secondary | ICD-10-CM | POA: Diagnosis not present

## 2020-11-26 DIAGNOSIS — I2721 Secondary pulmonary arterial hypertension: Secondary | ICD-10-CM | POA: Diagnosis not present

## 2020-11-26 DIAGNOSIS — Z6823 Body mass index (BMI) 23.0-23.9, adult: Secondary | ICD-10-CM | POA: Diagnosis not present

## 2020-11-26 DIAGNOSIS — F411 Generalized anxiety disorder: Secondary | ICD-10-CM | POA: Diagnosis not present

## 2020-11-26 DIAGNOSIS — N400 Enlarged prostate without lower urinary tract symptoms: Secondary | ICD-10-CM | POA: Diagnosis not present

## 2020-11-26 DIAGNOSIS — Z139 Encounter for screening, unspecified: Secondary | ICD-10-CM | POA: Diagnosis not present

## 2020-11-26 DIAGNOSIS — N529 Male erectile dysfunction, unspecified: Secondary | ICD-10-CM | POA: Diagnosis not present

## 2020-11-26 DIAGNOSIS — Z79899 Other long term (current) drug therapy: Secondary | ICD-10-CM | POA: Diagnosis not present

## 2021-02-14 DIAGNOSIS — I10 Essential (primary) hypertension: Secondary | ICD-10-CM | POA: Diagnosis not present

## 2021-02-14 DIAGNOSIS — N529 Male erectile dysfunction, unspecified: Secondary | ICD-10-CM | POA: Diagnosis not present

## 2021-02-14 DIAGNOSIS — F411 Generalized anxiety disorder: Secondary | ICD-10-CM | POA: Diagnosis not present

## 2021-02-14 DIAGNOSIS — R972 Elevated prostate specific antigen [PSA]: Secondary | ICD-10-CM | POA: Diagnosis not present

## 2021-02-14 DIAGNOSIS — N400 Enlarged prostate without lower urinary tract symptoms: Secondary | ICD-10-CM | POA: Diagnosis not present

## 2021-02-14 DIAGNOSIS — I2721 Secondary pulmonary arterial hypertension: Secondary | ICD-10-CM | POA: Diagnosis not present

## 2021-02-14 DIAGNOSIS — Z6823 Body mass index (BMI) 23.0-23.9, adult: Secondary | ICD-10-CM | POA: Diagnosis not present

## 2021-02-14 DIAGNOSIS — Z79899 Other long term (current) drug therapy: Secondary | ICD-10-CM | POA: Diagnosis not present

## 2021-03-19 DIAGNOSIS — N289 Disorder of kidney and ureter, unspecified: Secondary | ICD-10-CM | POA: Diagnosis not present

## 2021-03-26 ENCOUNTER — Encounter: Payer: Self-pay | Admitting: Cardiology

## 2021-03-26 ENCOUNTER — Encounter: Payer: Self-pay | Admitting: *Deleted

## 2021-04-01 DIAGNOSIS — R609 Edema, unspecified: Secondary | ICD-10-CM | POA: Diagnosis not present

## 2021-04-01 DIAGNOSIS — M25562 Pain in left knee: Secondary | ICD-10-CM | POA: Diagnosis not present

## 2021-04-01 DIAGNOSIS — Z79899 Other long term (current) drug therapy: Secondary | ICD-10-CM | POA: Diagnosis not present

## 2021-04-02 DIAGNOSIS — M25562 Pain in left knee: Secondary | ICD-10-CM | POA: Diagnosis not present

## 2021-04-15 DIAGNOSIS — M25511 Pain in right shoulder: Secondary | ICD-10-CM | POA: Diagnosis not present

## 2021-04-23 DIAGNOSIS — M25511 Pain in right shoulder: Secondary | ICD-10-CM | POA: Diagnosis not present

## 2021-04-29 DIAGNOSIS — M25511 Pain in right shoulder: Secondary | ICD-10-CM | POA: Diagnosis not present

## 2021-04-29 DIAGNOSIS — M19011 Primary osteoarthritis, right shoulder: Secondary | ICD-10-CM | POA: Diagnosis not present

## 2021-04-29 DIAGNOSIS — M75101 Unspecified rotator cuff tear or rupture of right shoulder, not specified as traumatic: Secondary | ICD-10-CM | POA: Diagnosis not present

## 2021-05-06 DIAGNOSIS — E559 Vitamin D deficiency, unspecified: Secondary | ICD-10-CM | POA: Diagnosis not present

## 2021-05-06 DIAGNOSIS — M47814 Spondylosis without myelopathy or radiculopathy, thoracic region: Secondary | ICD-10-CM | POA: Diagnosis not present

## 2021-05-06 DIAGNOSIS — R9431 Abnormal electrocardiogram [ECG] [EKG]: Secondary | ICD-10-CM | POA: Diagnosis not present

## 2021-05-06 DIAGNOSIS — Z01818 Encounter for other preprocedural examination: Secondary | ICD-10-CM | POA: Diagnosis not present

## 2021-05-06 DIAGNOSIS — M79609 Pain in unspecified limb: Secondary | ICD-10-CM | POA: Diagnosis not present

## 2021-05-06 DIAGNOSIS — R918 Other nonspecific abnormal finding of lung field: Secondary | ICD-10-CM | POA: Diagnosis not present

## 2021-05-06 DIAGNOSIS — Z79899 Other long term (current) drug therapy: Secondary | ICD-10-CM | POA: Diagnosis not present

## 2021-05-07 NOTE — Progress Notes (Signed)
Cardiology Office Note:    Date:  05/08/2021   ID:  Joshua Valenzuela, DOB [birth year], MRN [id number]  :  Helen Hashimoto., MD  Cardiologist:  Shirlee More, MD   Referring MD: Helen Hashimoto., MD  ASSESSMENT:    1. Sinus bradycardia   2. Resistant hypertension   3. Nonrheumatic aortic valve stenosis   4. Stage 3 chronic kidney disease, unspecified whether stage 3a or 3b CKD ([place])    PLAN:    In order of problems listed above:  This is a very complex presentation.  Despite the referral for sinus bradycardia it becomes apparent this is a longstanding asymptomatic problem and not diabetes and is here today and he does not want to pursue further evaluation.  Advised him to consider purchasing a smart watch with heart rate alarms. He has decades long poorly controlled hypertension is resistant complicated with hypertensive heart disease LVH and chronic kidney disease stage III.  I asked him to go ahead and start his MRA discontinue potassium and come back in 1 week for a BMP and today we will also check a BMP and a renal and aldosterone level.  Also check renal vascular duplex. Asymptomatic will need a follow-up echo in 1 year Recheck his BMP today along with a renal aldosterone level in 1 week after starting his diuretic  Next appointment 6 weeks   Medication Adjustments/Labs and Tests Ordered: Current medicines are reviewed at length with the patient today.  Concerns regarding medicines are outlined above.  No orders of the defined types were placed in this encounter.  No orders of the defined types were placed in this encounter.    Chief Complaint  Patient presents with   Hypertension    History of Present Illness:    Joshua Valenzuela is a 75 y.o. male who is being seen today for the evaluation of bradycardia at the request of Helen Hashimoto., MD.  He had an outpatient EKG performed yesterday 05/06/2021 showing sinus bradycardia heart rate of 38 bpm.  He had been  seen by Dr. Fletcher Anon 2012 with resistant hypertension and sinus bradycardia.  At that time his heart rate was 43 bpm.  There is a notation that he had a treadmill stress test performed that was interpreted as negative.  Records from [facility] hospital showed he is a creatinine 1.54 GFR 46 cc stage III CKD he has hypertension being treated with a thiazide diuretic and ACE inhibitor and a history of previous bradycardia with heart rates described as often in the 40s and occasionally down in the 30s felt to be asymptomatic and was not advised pacemaker therapy in the past.  Echocardiogram done 08/16/2020 showed moderate concentric LVH normal ejection fraction 55 to 60% he had grade 1 diastolic dysfunction with elevated filling pressures right ventricle is mildly dilated normal systolic function left atrium severely dilated right atrium moderately dilated he had mild valvular aortic stenosis pulmonary artery systolic pressure was normal 30 to 35 mmHg.  Mr. Jawad is quite certain that he is here regarding her blood pressure I am not for bradycardia okay he tells me his adult life he has had a slow heart rate he is asymptomatic and is not a problem He has had longstanding hypertension for 3 decades or longer his blood pressures typically run in the range of 389 systolic or greater and he has failed treatment with minoxidil that caused severe edema and took hydralazine in the past.  He requested to be seen today in my  office He has no known history of heart disease but he has a murmur and his echocardiogram shows mild aortic stenosis. He has had no palpitation or syncope. He has been maintained on thiazide diuretic ACE inhibitor and has been taking a potassium supplement but does not know why. He was given a prescription to start an MRA but he has not done it yet.  Hypertension has been managed by the [facility] hospital.  Past Medical History:  Diagnosis Date   Closed fracture of left tibial plateau 11/06/2016   Formatting of  this note might be different from the original. Added automatically from request for surgery [id number]   ED (erectile dysfunction)    Erectile dysfunction due to arterial insufficiency 12/07/2017   H/O urinary retention 01/29/2018   HTN (hypertension)    Obesity    s/p gastric bypass   Sinus bradycardia     Past Surgical History:  Procedure Laterality Date   FRACTURE SURGERY     1966 and 2018/ Pin inserted   GASTRIC BYPASS  06/02/2002   HERNIA REPAIR  60's   PENILE PROSTHESIS PLACEMENT  01/27/2018   Procedure: Insertion of a 3 piece penile implant; Surgeon: Hillery Hunter, MD;Location: [facility] OR   TONSILLECTOMY      Current Medications: Current Meds  Medication Sig   b complex vitamins capsule Take 2 capsules by mouth every morning.   Calcium Carbonate-Vitamin D (OYSTER SHELL CALCIUM/D) 500-5 MG-MCG TABS Take 5,000 Units by mouth every morning.   chlorthalidone (HYGROTON) 25 MG tablet Take 25 mg by mouth daily.   Cholecalciferol 50 MCG (2000 UT) TABS Take 2 tablets by mouth daily.   DHEA 25 MG CAPS Take 25 mg by mouth every morning.   eplerenone (INSPRA) 25 MG tablet Take 1 tablet by mouth daily.   ferrous sulfate 325 (65 FE) MG tablet Take 325 mg by mouth every morning.   lisinopril (ZESTRIL) 20 MG tablet Take 20 mg by mouth daily.   magnesium oxide (MAG-OX) 400 MG tablet Take 400 mg by mouth 2 (two) times daily.   Multiple Vitamin (MULTIVITAMIN) tablet Take 1 tablet by mouth daily.     Omega-3 Fatty Acids (FISH OIL) 1000 MG CAPS Take 1,200 mg by mouth 2 (two) times daily.   Potassium 99 MG TABS Take 99 mg by mouth 2 (two) times daily.   selenium 50 MCG TABS tablet Take 200 mcg by mouth 2 (two) times daily.   vitamin E 1000 UNIT capsule Take 1,000 Units by mouth 2 (two) times daily.     Allergies:   Patient has no known allergies.   Social History   Socioeconomic History   Marital status: Married    Spouse name: Not on file   Number of children: Not on file   Years of  education: Not on file   Highest education level: Not on file  Occupational History   Occupation: retired  Tobacco Use   Smoking status: Former    Types: Cigarettes    Quit date: 06/01/1976    Years since quitting: 44.9   Smokeless tobacco: Never  Substance and Sexual Activity   Alcohol use: No   Drug use: No   Sexual activity: Not on file  Other Topics Concern   Not on file  Social History Narrative   Not on file   Social Determinants of Health   Financial Resource Strain: Not on file  Food Insecurity: Not on file  Transportation Needs: Not on file  Physical Activity: Not  on file  Stress: Not on file  Social Connections: Not on file     Family History: The patient's family history is not on file.  ROS:   ROS Please see the history of present illness.     All other systems reviewed and are negative.  EKGs/Labs/Other Studies Reviewed:    The following studies were reviewed today:  See history  Physical Exam:    VS:  BP (!) 170/104 (BP Location: Left Arm, Patient Position: Sitting, Cuff Size: Normal)   Pulse (!) 38   Ht 5\' 5"  (1.651 m)   Wt 140 lb (63.5 kg)   SpO2 98%   BMI 23.30 kg/m     Wt Readings from Last 3 Encounters:  05/08/21 140 lb (63.5 kg)  01/29/18 150 lb (68 kg)     GEN: Quite thin he is having bariatric surgery performed well nourished, well developed in no acute distress HEENT: Normal NECK: No JVD; No carotid bruits LYMPHATICS: No lymphadenopathy CARDIAC: Grade 2/6 murmur aortic outflow murmur radiates from the apex aortic area up to the left carotid artery S2 seems single certainly seems like much more severe aortic stenosis and his echocardiogram revealed RRR,  RESPIRATORY:  Clear to auscultation without rales, wheezing or rhonchi  ABDOMEN: Soft, non-tender, non-distended MUSCULOSKELETAL:  No edema; No deformity  SKIN: Warm and dry NEUROLOGIC:  Alert and oriented x 3 PSYCHIATRIC:  Normal affect     Signed, Shirlee More, MD   05/08/2021 8:52 AM    [place]

## 2021-05-08 ENCOUNTER — Ambulatory Visit: Payer: Medicare HMO | Admitting: Cardiology

## 2021-05-08 ENCOUNTER — Encounter: Payer: Self-pay | Admitting: Cardiology

## 2021-05-08 ENCOUNTER — Other Ambulatory Visit: Payer: Self-pay

## 2021-05-08 VITALS — BP 170/104 | HR 38 | Ht 65.0 in | Wt 140.0 lb

## 2021-05-08 DIAGNOSIS — R001 Bradycardia, unspecified: Secondary | ICD-10-CM | POA: Diagnosis not present

## 2021-05-08 DIAGNOSIS — I10 Essential (primary) hypertension: Secondary | ICD-10-CM | POA: Diagnosis not present

## 2021-05-08 DIAGNOSIS — N183 Chronic kidney disease, stage 3 unspecified: Secondary | ICD-10-CM

## 2021-05-08 DIAGNOSIS — I350 Nonrheumatic aortic (valve) stenosis: Secondary | ICD-10-CM

## 2021-05-08 HISTORY — DX: Chronic kidney disease, stage 3 unspecified: N18.30

## 2021-05-08 NOTE — Patient Instructions (Addendum)
Medication Instructions:  Your physician has recommended you make the following change in your medication:   Stop Potassium Start Eplerenone   *If you need a refill on your cardiac medications before your next appointment, please call your pharmacy*   Lab Work: Your physician recommends that you have a BMP and resin aldosterone today in the office.  Your physician recommends that you return for lab work in: 1 week You do not need to fast.  You can come Monday through Friday 8:30 am to 12:00 pm and 1:15 to 4:30. You do not need to make an appointment as the order has already been placed. The lab is for BMET.   If you have labs (blood work) drawn today and your tests are completely normal, you will receive your results only by: [place] (if you have MyChart) OR A paper copy in the mail If you have any lab test that is abnormal or we need to change your treatment, we will call you to review the results.   Testing/Procedures: Your physician has requested that you have a renal artery duplex. During this test, an ultrasound is used to evaluate blood flow to the kidneys. Allow one hour for this exam. Do not eat after midnight the day before and avoid carbonated beverages. Take your medications as you usually do.    Follow-Up: At [facility], you and your health needs are our priority.  As part of our continuing mission to provide you with exceptional heart care, we have created designated Provider Care Teams.  These Care Teams include your primary Cardiologist (physician) and Advanced Practice Providers (APPs -  Physician Assistants and Nurse Practitioners) who all work together to provide you with the care you need, when you need it.  We recommend signing up for the patient portal called "MyChart".  Sign up information is provided on this After Visit Summary.  MyChart is used to connect with patients for Virtual Visits (Telemedicine).  Patients are able to view lab/test results,  encounter notes, upcoming appointments, etc.  Non-urgent messages can be sent to your provider as well.   To learn more about what you can do with MyChart, go to [email or web address].    Your next appointment:   6 week(s)  The format for your next appointment:   In Person  Provider:   Shirlee More, MD   Other Instructions  Blood Pressure Record Sheet To take your blood pressure, you will need a blood pressure machine. You can buy a blood pressure machine (blood pressure monitor) at your clinic, drug store, or online. When choosing one, consider: An automatic monitor that has an arm cuff. A cuff that wraps snugly around your upper arm. You should be able to fit only one finger between your arm and the cuff. A device that stores blood pressure reading results. Do not choose a monitor that measures your blood pressure from your wrist or finger. Follow your health care provider's instructions for how to take your blood pressure. To use this form: Get one reading in the morning (a.m.) 1-2 hours after you take any medicines. Get one reading in the evening (p.m.) before supper. Take at least 2 readings with each blood pressure check. This makes sure the results are correct. Wait 1-2 minutes between measurements. Write down the results in the spaces on this form. Repeat this once a week, or as told by your health care provider.  Make a follow-up appointment with your health care provider to discuss the results. Blood pressure  log Date: _______________________ a.m. _____________________(1st reading) HR___________            p.m. _____________________(2nd reading) HR__________  Date: _______________________ a.m. _____________________(1st reading) HR___________            p.m. _____________________(2nd reading) HR__________ Date: _______________________ a.m. _____________________(1st reading) HR___________            p.m. _____________________(2nd reading) HR__________ Date:  _______________________ a.m. _____________________(1st reading) HR___________            p.m. _____________________(2nd reading) HR__________  Date: _______________________ a.m. _____________________(1st reading) HR___________            p.m. _____________________(2nd reading) HR__________  Date: _______________________ a.m. _____________________(1st reading) HR___________            p.m. _____________________(2nd reading) HR__________  Date: _______________________ a.m. _____________________(1st reading) HR___________            p.m. _____________________(2nd reading) HR__________   This information is not intended to replace advice given to you by your health care provider. Make sure you discuss any questions you have with your health care provider. Document Revised: 09/07/2019 Document Reviewed: 09/07/2019 Elsevier Patient Education  2021 [employer].

## 2021-05-13 DIAGNOSIS — N183 Chronic kidney disease, stage 3 unspecified: Secondary | ICD-10-CM | POA: Diagnosis not present

## 2021-05-13 DIAGNOSIS — Z6822 Body mass index (BMI) 22.0-22.9, adult: Secondary | ICD-10-CM | POA: Diagnosis not present

## 2021-05-13 DIAGNOSIS — M19011 Primary osteoarthritis, right shoulder: Secondary | ICD-10-CM | POA: Diagnosis not present

## 2021-05-13 DIAGNOSIS — I2721 Secondary pulmonary arterial hypertension: Secondary | ICD-10-CM | POA: Diagnosis not present

## 2021-05-13 DIAGNOSIS — Z01818 Encounter for other preprocedural examination: Secondary | ICD-10-CM | POA: Diagnosis not present

## 2021-05-14 LAB — BASIC METABOLIC PANEL
BUN/Creatinine Ratio: 26 — ABNORMAL HIGH (ref 10–24)
BUN: 43 mg/dL — ABNORMAL HIGH (ref 8–27)
CO2: 26 mmol/L (ref 20–29)
Calcium: 9.2 mg/dL (ref 8.6–10.2)
Chloride: 102 mmol/L (ref 96–106)
Creatinine, Ser: 1.63 mg/dL — ABNORMAL HIGH (ref 0.76–1.27)
Glucose: 97 mg/dL (ref 70–99)
Potassium: 3.4 mmol/L — ABNORMAL LOW (ref 3.5–5.2)
Sodium: 145 mmol/L — ABNORMAL HIGH (ref 134–144)
eGFR: 43 mL/min/{1.73_m2} — ABNORMAL LOW (ref >59)

## 2021-05-14 LAB — ALDOSTERONE + RENIN ACTIVITY W/ RATIO
ALDOS/RENIN RATIO: >19.8 (ref 0.0–30.0)
ALDOSTERONE: 3.3 ng/dL (ref 0.0–30.0)
Renin: <0.167 ng/mL/hr — ABNORMAL LOW (ref 0.167–5.380)

## 2021-05-15 ENCOUNTER — Telehealth: Payer: Self-pay

## 2021-05-15 DIAGNOSIS — N183 Chronic kidney disease, stage 3 unspecified: Secondary | ICD-10-CM

## 2021-05-15 NOTE — Telephone Encounter (Signed)
-----   Message from Richardo Priest, MD sent at 05/15/2021  8:22 AM EST -----  ----- Message ----- From: Resa Miner I, RN Sent: 05/15/2021   8:05 AM EST To: Richardo Priest, MD  Do you mean discontinue the inspra?  Lilia Pro, RN ----- Message ----- From: Richardo Priest, MD Sent: 05/14/2021  12:30 PM EST To: Cv Div Ash/Hp Triage  I would discontinue the new diuretic and started Inspra he has had a significant change in kidney function recheck his BMP in about 1 week

## 2021-05-15 NOTE — Telephone Encounter (Signed)
Spoke with patient regarding results and recommendation.  Patient verbalizes understanding and is agreeable to plan of care. Advised patient to call back with any issues or concerns.  

## 2021-05-17 ENCOUNTER — Other Ambulatory Visit: Payer: Self-pay

## 2021-05-17 ENCOUNTER — Ambulatory Visit (INDEPENDENT_AMBULATORY_CARE_PROVIDER_SITE_OTHER): Payer: Medicare HMO

## 2021-05-17 DIAGNOSIS — I10 Essential (primary) hypertension: Secondary | ICD-10-CM

## 2021-05-17 DIAGNOSIS — N183 Chronic kidney disease, stage 3 unspecified: Secondary | ICD-10-CM

## 2021-05-17 DIAGNOSIS — I1A0 Resistant hypertension: Secondary | ICD-10-CM

## 2021-05-20 ENCOUNTER — Telehealth: Payer: Self-pay

## 2021-05-20 NOTE — Telephone Encounter (Signed)
Spoke with patient regarding results and recommendation.  Patient verbalizes understanding and is agreeable to plan of care. Advised patient to call back with any issues or concerns.  

## 2021-05-20 NOTE — Telephone Encounter (Signed)
-----   Message from Richardo Priest, MD sent at 05/18/2021  1:09 PM EST ----- Good result no stenosis or blockage of the kidney arteries.  He has small cysts in both kidneys typically does not create a problem.

## 2021-05-22 ENCOUNTER — Telehealth: Payer: Self-pay

## 2021-05-22 DIAGNOSIS — R001 Bradycardia, unspecified: Secondary | ICD-10-CM | POA: Diagnosis not present

## 2021-05-22 DIAGNOSIS — N183 Chronic kidney disease, stage 3 unspecified: Secondary | ICD-10-CM | POA: Diagnosis not present

## 2021-05-22 DIAGNOSIS — I10 Essential (primary) hypertension: Secondary | ICD-10-CM | POA: Diagnosis not present

## 2021-05-22 DIAGNOSIS — I350 Nonrheumatic aortic (valve) stenosis: Secondary | ICD-10-CM | POA: Diagnosis not present

## 2021-05-22 LAB — BASIC METABOLIC PANEL
BUN/Creatinine Ratio: 20 (ref 10–24)
BUN: 27 mg/dL (ref 8–27)
CO2: 29 mmol/L (ref 20–29)
Calcium: 9 mg/dL (ref 8.6–10.2)
Chloride: 103 mmol/L (ref 96–106)
Creatinine, Ser: 1.36 mg/dL — ABNORMAL HIGH (ref 0.76–1.27)
Glucose: 104 mg/dL — ABNORMAL HIGH (ref 70–99)
Potassium: 4 mmol/L (ref 3.5–5.2)
Sodium: 144 mmol/L (ref 134–144)
eGFR: 54 mL/min/{1.73_m2} — ABNORMAL LOW (ref >59)

## 2021-05-22 NOTE — Telephone Encounter (Signed)
-----   Message from Richardo Priest, MD sent at 05/22/2021  4:49 PM EST ----- Improved but still not at baseline lets wait about 3 to 4 weeks and recheck BMP.

## 2021-05-22 NOTE — Telephone Encounter (Signed)
Patient notified of results.

## 2021-05-29 DIAGNOSIS — M75101 Unspecified rotator cuff tear or rupture of right shoulder, not specified as traumatic: Secondary | ICD-10-CM | POA: Diagnosis not present

## 2021-05-29 DIAGNOSIS — M19011 Primary osteoarthritis, right shoulder: Secondary | ICD-10-CM | POA: Diagnosis not present

## 2021-05-29 DIAGNOSIS — M25511 Pain in right shoulder: Secondary | ICD-10-CM | POA: Diagnosis not present

## 2021-06-06 ENCOUNTER — Telehealth: Payer: Self-pay | Admitting: Cardiology

## 2021-06-06 NOTE — Telephone Encounter (Signed)
° °  Patient Name: Joshua Valenzuela  DOB: Dec 22, 1945 MRN: [id number]  Primary Cardiologist: Shirlee More, MD  Chart reviewed as part of pre-operative protocol coverage. We are asked to provide cardiac clearance for Right Shoulder orthoscopic rotator cuff repair for this patient scheduled for OR tomorrow. Dr. Bettina Gavia evaluated him on 05/08/21 with significant bradycardia, HR 38bpm, also with significant HTN BP 170/104. Initial plan was to start epleronone but initial labs showed Cr. 1.63 so he recommended not to start this medicine. Has not had additional antihypertensives added since that time. He was asymptomatic with his bradycardia and the patient did not want to pursue further evaluation for this.  Repeat labs 12/21 showed improvement in Cr to 1.36. With this recent hx he was not a candidate to clear solely on phone input so sought input from Dr. Bettina Gavia. Per Dr. Bettina Gavia, the bradcyardia is a longstanding problem and he's been asymptomatic - per his recommendation, "I would say at this point time I would proceed with his planned surgical procedure they could in the operating room if they would needed to to give him a little bit of atropine and they will need to be careful to choose anesthetic agents that do not induce bradycardia like propofol." Per our discussion he does not feel there is any specific upper limit BP which would preclude surgery. I reached out to patient for update on how he is doing. The patient affirms he has been doing well without any new cardiac symptoms. The patient was advised that if he develops new symptoms prior to surgery to contact our office to arrange for a follow-up visit, and he verbalized understanding.  Will route this bundled recommendation and provider's last OV to requesting provider via Epic fax function. Please call with questions.  Charlie Pitter, PA-C 06/06/2021, 2:43 PM

## 2021-06-06 NOTE — Telephone Encounter (Signed)
° °  Pre-operative Risk Assessment    Patient Name: Joshua Valenzuela  DOB: [birth year] MRN: [id number]     Request for Surgical Clearance    Procedure:   Right Shoulder orthoscopic rotator cuff repair   Date of Surgery:  Clearance 06/07/21                               Surgeon: Dr Ronne Binning Group or Practice Name:  [facility]  Phone number:  [phone] Fax number:  [phone]   Type of Clearance Requested:   - Medical    Type of Anesthesia:  General    Additional requests/questions:   office would like patients last office note as well as and last procedure   Signed, Milbert Coulter   06/06/2021, 1:23 PM

## 2021-06-07 DIAGNOSIS — I10 Essential (primary) hypertension: Secondary | ICD-10-CM | POA: Diagnosis not present

## 2021-06-07 DIAGNOSIS — M7521 Bicipital tendinitis, right shoulder: Secondary | ICD-10-CM | POA: Diagnosis not present

## 2021-06-07 DIAGNOSIS — M65811 Other synovitis and tenosynovitis, right shoulder: Secondary | ICD-10-CM | POA: Diagnosis not present

## 2021-06-07 DIAGNOSIS — M25511 Pain in right shoulder: Secondary | ICD-10-CM | POA: Diagnosis not present

## 2021-06-07 DIAGNOSIS — M19011 Primary osteoarthritis, right shoulder: Secondary | ICD-10-CM | POA: Diagnosis not present

## 2021-06-07 DIAGNOSIS — M24111 Other articular cartilage disorders, right shoulder: Secondary | ICD-10-CM | POA: Diagnosis not present

## 2021-06-07 DIAGNOSIS — R001 Bradycardia, unspecified: Secondary | ICD-10-CM | POA: Diagnosis not present

## 2021-06-07 DIAGNOSIS — M7551 Bursitis of right shoulder: Secondary | ICD-10-CM | POA: Diagnosis not present

## 2021-06-07 DIAGNOSIS — S46011A Strain of muscle(s) and tendon(s) of the rotator cuff of right shoulder, initial encounter: Secondary | ICD-10-CM | POA: Diagnosis not present

## 2021-06-07 DIAGNOSIS — G8918 Other acute postprocedural pain: Secondary | ICD-10-CM | POA: Diagnosis not present

## 2021-06-18 DIAGNOSIS — E669 Obesity, unspecified: Secondary | ICD-10-CM

## 2021-06-18 DIAGNOSIS — N529 Male erectile dysfunction, unspecified: Secondary | ICD-10-CM | POA: Insufficient documentation

## 2021-06-18 DIAGNOSIS — R001 Bradycardia, unspecified: Secondary | ICD-10-CM | POA: Insufficient documentation

## 2021-06-18 DIAGNOSIS — I10 Essential (primary) hypertension: Secondary | ICD-10-CM | POA: Insufficient documentation

## 2021-06-18 HISTORY — DX: Obesity, unspecified: E66.9

## 2021-06-23 NOTE — Progress Notes (Signed)
Cardiology Office Note:    Date:  06/24/2021   ID:  Joshua Valenzuela, DOB [birth year], MRN [id number]  PCP:  Helen Hashimoto., MD  Cardiologist:  Shirlee More, MD    Referring MD: Helen Hashimoto., MD    ASSESSMENT:    1. Resistant hypertension   2. Stage 3 chronic kidney disease, unspecified whether stage 3a or 3b CKD ([place])   3. Sinus bradycardia   4. Nonrheumatic aortic valve stenosis    PLAN:    In order of problems listed above:  Hypertension controlled markedly improved at 12 he is getting systolics less than 884 at home reduce his hydralazine to 50% continue thiazide diuretic lisinopril hydralazine and I think his alpha-blocker terazosin has had a marked effect on his blood pressure. Stable CKD Improved a little bit of a reflex tachycardia from hydralazine his alpha-blocker Stable mild stenosis   Next appointment: 3 months for BP check   Medication Adjustments/Labs and Tests Ordered: Current medicines are reviewed at length with the patient today.  Concerns regarding medicines are outlined above.  No orders of the defined types were placed in this encounter.  No orders of the defined types were placed in this encounter.   Chief complaint resistant hypertension   History of Present Illness:    Joshua Valenzuela is a 76 y.o. male with a hx of longstanding asymptomatic sinus bradycardia with poorly controlled hypertension resistant complicated with hypertensive heart disease with LVH and stage III CKD and mild valvular aortic stenosis on an echocardiogram performed 08/16/2020 last seen 05/08/2021.  Compliance with diet, lifestyle and medications: Yes  He started taking an alpha-blocker terazosin 2 mg at bedtime and has blood pressures in our normal. No edema chest pain shortness of breath palpitation no orthostatic symptoms  A renal vascular duplex performed 05/17/2021 for hypertension showing renal cyst bilaterally normal resistive index bilaterally normal kidney  size and no finding of renal artery stenosis. His aldosterone renin level was normal with low renin activity  He had an outpatient EKG performed 05/06/2021 showing sinus bradycardia heart rate of 38 bpm.   He had been seen by Dr. Fletcher Anon 2012 with resistant hypertension and sinus bradycardia.  At that time his heart rate was 43 bpm.  There is a notation that he had a treadmill stress test performed that was interpreted as negative.   Records from [facility] hospital showed he is a creatinine 1.54 GFR 46 cc stage III CKD he has hypertension being treated with a thiazide diuretic and ACE inhibitor and a history of previous bradycardia with heart rates described as often in the 40s and occasionally down in the 30s felt to be asymptomatic and was not advised pacemaker therapy in the past.   Echocardiogram done 08/16/2020 showed moderate concentric LVH normal ejection fraction 55 to 60% he had grade 1 diastolic dysfunction with elevated filling pressures right ventricle is mildly dilated normal systolic function left atrium severely dilated right atrium moderately dilated he had mild valvular aortic stenosis pulmonary artery systolic pressure was normal 30 to 35 mmHg.  Past Medical History:  Diagnosis Date   Closed fracture of left tibial plateau 11/06/2016   Formatting of this note might be different from the original. Added automatically from request for surgery [id number]   ED (erectile dysfunction)    Erectile dysfunction due to arterial insufficiency 12/07/2017   H/O urinary retention 01/29/2018   HTN (hypertension)    Obesity    s/p gastric bypass   Sinus bradycardia  Past Surgical History:  Procedure Laterality Date   FRACTURE SURGERY     1966 and 2018/ Pin inserted   GASTRIC BYPASS  06/02/2002   HERNIA REPAIR  60's   PENILE PROSTHESIS PLACEMENT  01/27/2018   Procedure: Insertion of a 3 piece penile implant; Surgeon: Hillery Hunter, MD;Location: [facility] OR   TONSILLECTOMY      Current  Medications: Current Meds  Medication Sig   chlorthalidone (HYGROTON) 25 MG tablet Take 25 mg by mouth daily.   hydrALAZINE (APRESOLINE) 25 MG tablet Take 25 mg by mouth 2 (two) times daily.   lisinopril (ZESTRIL) 20 MG tablet Take 20 mg by mouth daily.     Allergies:   Amlodipine   Social History   Socioeconomic History   Marital status: Married    Spouse name: Not on file   Number of children: Not on file   Years of education: Not on file   Highest education level: Not on file  Occupational History   Occupation: retired  Tobacco Use   Smoking status: Former    Types: Cigarettes    Quit date: 06/01/1976    Years since quitting: 45.0   Smokeless tobacco: Never  Substance and Sexual Activity   Alcohol use: No   Drug use: No   Sexual activity: Not on file  Other Topics Concern   Not on file  Social History Narrative   Not on file   Social Determinants of Health   Financial Resource Strain: Not on file  Food Insecurity: Not on file  Transportation Needs: Not on file  Physical Activity: Not on file  Stress: Not on file  Social Connections: Not on file     Family History: The patient's family history is not on file. ROS:   Please see the history of present illness.    All other systems reviewed and are negative.  EKGs/Labs/Other Studies Reviewed:    The following studies were reviewed today:    Recent Labs: 05/22/2021: BUN 27; Creatinine, Ser 1.36; Potassium 4.0; Sodium 144  Recent Lipid Panel 05/28/2020: Cholesterol 177 LDL 79 triglycerides 66 HDL 85  Physical Exam:    VS:  There were no vitals taken for this visit.    Wt Readings from Last 3 Encounters:  05/08/21 140 lb (63.5 kg)  01/29/18 150 lb (68 kg)     GEN:  Well nourished, well developed in no acute distress HEENT: Normal NECK: No JVD; No carotid bruits LYMPHATICS: No lymphadenopathy CARDIAC: RRR, no murmurs, rubs, gallops RESPIRATORY:  Clear to auscultation without rales, wheezing or  rhonchi  ABDOMEN: Soft, non-tender, non-distended MUSCULOSKELETAL:  No edema; No deformity  SKIN: Warm and dry NEUROLOGIC:  Alert and oriented x 3 PSYCHIATRIC:  Normal affect    Signed, Shirlee More, MD  06/24/2021 12:52 PM    [facility]

## 2021-06-24 ENCOUNTER — Ambulatory Visit: Payer: Medicare HMO | Admitting: Cardiology

## 2021-06-24 ENCOUNTER — Encounter: Payer: Self-pay | Admitting: Cardiology

## 2021-06-24 ENCOUNTER — Other Ambulatory Visit: Payer: Self-pay

## 2021-06-24 DIAGNOSIS — I10 Essential (primary) hypertension: Secondary | ICD-10-CM

## 2021-06-24 DIAGNOSIS — I350 Nonrheumatic aortic (valve) stenosis: Secondary | ICD-10-CM

## 2021-06-24 DIAGNOSIS — N183 Chronic kidney disease, stage 3 unspecified: Secondary | ICD-10-CM | POA: Diagnosis not present

## 2021-06-24 DIAGNOSIS — R001 Bradycardia, unspecified: Secondary | ICD-10-CM | POA: Diagnosis not present

## 2021-06-24 NOTE — Patient Instructions (Signed)
Medication Instructions:  Your physician has recommended you make the following change in your medication:  If your blood pressure is consistently <120 on top decrease your hydralazine to 12.5 mg by mouth twice daily.  *If you need a refill on your cardiac medications before your next appointment, please call your pharmacy*   Lab Work: None If you have labs (blood work) drawn today and your tests are completely normal, you will receive your results only by: [place] (if you have MyChart) OR A paper copy in the mail If you have any lab test that is abnormal or we need to change your treatment, we will call you to review the results.   Testing/Procedures: None   Follow-Up: At [facility], you and your health needs are our priority.  As part of our continuing mission to provide you with exceptional heart care, we have created designated Provider Care Teams.  These Care Teams include your primary Cardiologist (physician) and Advanced Practice Providers (APPs -  Physician Assistants and Nurse Practitioners) who all work together to provide you with the care you need, when you need it.  We recommend signing up for the patient portal called "MyChart".  Sign up information is provided on this After Visit Summary.  MyChart is used to connect with patients for Virtual Visits (Telemedicine).  Patients are able to view lab/test results, encounter notes, upcoming appointments, etc.  Non-urgent messages can be sent to your provider as well.   To learn more about what you can do with MyChart, go to [email or web address].    Your next appointment:   3 month(s)  The format for your next appointment:   In Person  Provider:   Shirlee More, MD    Other Instructions

## 2021-07-09 ENCOUNTER — Telehealth: Payer: Self-pay | Admitting: Cardiology

## 2021-07-09 NOTE — Telephone Encounter (Signed)
Recommendations reviewed with pt as per Dr. Munley's note.  Pt verbalized understanding and had no additional questions.  

## 2021-07-09 NOTE — Telephone Encounter (Signed)
Spoke with pt who states the sx have been ongoing for 2 weeks. Pt states that he has been taking Hydralazine 25 mg once daily rather than 2 times daily to see if the swelling improved but he has not noticed a difference other than his BP has been a little higher. How do you advise?

## 2021-07-09 NOTE — Telephone Encounter (Signed)
Pt c/o medication issue:  1. Name of Medication: not sure which one  2. How are you currently taking this medication (dosage and times per day)? N/a  3. Are you having a reaction (difficulty breathing--STAT)? yes  4. What is your medication issue? Pt states that his meds are causing swelling in his feet, legs and around his stomach.   Pt c/o swelling: STAT is pt has developed SOB within 24 hours  If swelling, where is the swelling located? Feet, legs and stomach   How much weight have you gained and in what time span? 15 lbs in a few weeks   Have you gained 3 pounds in a day or 5 pounds in a week? Yes   Do you have a log of your daily weights (if so, list)? no  Are you currently taking a fluid pill? yes  Are you currently SOB? no  Have you traveled recently? no

## 2021-07-09 NOTE — Addendum Note (Signed)
Addended by: Truddie Hidden on: 07/09/2021 01:03 PM   Modules accepted: Orders

## 2021-07-23 DIAGNOSIS — F411 Generalized anxiety disorder: Secondary | ICD-10-CM | POA: Diagnosis not present

## 2021-07-23 DIAGNOSIS — Z79899 Other long term (current) drug therapy: Secondary | ICD-10-CM | POA: Diagnosis not present

## 2021-07-25 DIAGNOSIS — M25511 Pain in right shoulder: Secondary | ICD-10-CM | POA: Diagnosis not present

## 2021-07-25 DIAGNOSIS — R531 Weakness: Secondary | ICD-10-CM | POA: Diagnosis not present

## 2021-07-25 DIAGNOSIS — M25611 Stiffness of right shoulder, not elsewhere classified: Secondary | ICD-10-CM | POA: Diagnosis not present

## 2021-08-21 DIAGNOSIS — E785 Hyperlipidemia, unspecified: Secondary | ICD-10-CM | POA: Diagnosis not present

## 2021-08-21 DIAGNOSIS — Z9181 History of falling: Secondary | ICD-10-CM | POA: Diagnosis not present

## 2021-08-21 DIAGNOSIS — Z1331 Encounter for screening for depression: Secondary | ICD-10-CM | POA: Diagnosis not present

## 2021-08-21 DIAGNOSIS — Z0000 Encounter for general adult medical examination without abnormal findings: Secondary | ICD-10-CM | POA: Diagnosis not present

## 2021-09-20 DIAGNOSIS — D485 Neoplasm of uncertain behavior of skin: Secondary | ICD-10-CM

## 2021-09-20 DIAGNOSIS — Z48810 Encounter for surgical aftercare following surgery on the sense organs: Secondary | ICD-10-CM

## 2021-09-20 DIAGNOSIS — H269 Unspecified cataract: Secondary | ICD-10-CM

## 2021-09-20 DIAGNOSIS — Z961 Presence of intraocular lens: Secondary | ICD-10-CM

## 2021-09-20 DIAGNOSIS — H25813 Combined forms of age-related cataract, bilateral: Secondary | ICD-10-CM

## 2021-09-20 DIAGNOSIS — Z01818 Encounter for other preprocedural examination: Secondary | ICD-10-CM

## 2021-09-20 DIAGNOSIS — K4090 Unilateral inguinal hernia, without obstruction or gangrene, not specified as recurrent: Secondary | ICD-10-CM

## 2021-09-20 DIAGNOSIS — I272 Pulmonary hypertension, unspecified: Secondary | ICD-10-CM | POA: Insufficient documentation

## 2021-09-20 DIAGNOSIS — Z91148 Patient's other noncompliance with medication regimen for other reason: Secondary | ICD-10-CM | POA: Insufficient documentation

## 2021-09-20 DIAGNOSIS — R001 Bradycardia, unspecified: Secondary | ICD-10-CM

## 2021-09-20 HISTORY — DX: Unspecified cataract: H26.9

## 2021-09-20 HISTORY — DX: Neoplasm of uncertain behavior of skin: D48.5

## 2021-09-20 HISTORY — DX: Presence of intraocular lens: Z96.1

## 2021-09-20 HISTORY — DX: Combined forms of age-related cataract, bilateral: H25.813

## 2021-09-20 HISTORY — DX: Encounter for surgical aftercare following surgery on the sense organs: Z48.810

## 2021-09-20 HISTORY — DX: Pulmonary hypertension, unspecified: I27.20

## 2021-09-20 HISTORY — DX: Unilateral inguinal hernia, without obstruction or gangrene, not specified as recurrent: K40.90

## 2021-09-20 HISTORY — DX: Bradycardia, unspecified: R00.1

## 2021-09-20 HISTORY — DX: Encounter for other preprocedural examination: Z01.818

## 2021-09-20 HISTORY — DX: Patient's other noncompliance with medication regimen for other reason: Z91.148

## 2021-09-23 NOTE — Progress Notes (Signed)
?Cardiology Office Note:   ? ?Date:  09/24/2021  ? ?ID:  Joshua Valenzuela, DOB Aug 25, 1945, MRN [id number] ? ?PCP:  Helen Hashimoto., MD  ?Cardiologist:  Shirlee More, MD   ? ?Referring MD: Helen Hashimoto., MD  ? ? ?ASSESSMENT:   ? ?1. Resistant hypertension   ?2. Sinus bradycardia   ?3. Stage 3 chronic kidney disease, unspecified whether stage 3a or 3b CKD ([place])   ?4. Nonrheumatic aortic valve stenosis   ? ?PLAN:   ? ?In order of problems listed above: ? ?He is doing well with his hypertension I gave him instructions education asked him to trend and record home blood pressures continue his current medical regimen stable CKD ?Asymptomatic again I encouraged him to get a smart watch to look for low heart rates at home and he said he would purchase either the Apple Watch or Fitbit to pair with his phone ?Recheck 1 year in the office echocardiogram before his visit ? ? ?Next appointment: 1 year ? ? ?Medication Adjustments/Labs and Tests Ordered: ?Current medicines are reviewed at length with the patient today.  Concerns regarding medicines are outlined above.  ?No orders of the defined types were placed in this encounter. ? ?No orders of the defined types were placed in this encounter. ? ? ?Chief Complaint  ?Patient presents with  ? Follow-up  ? Hypertension  ? Bradycardia  ? Aortic Stenosis  ? ? ?History of Present Illness:   ? ?Joshua Valenzuela is a 76 y.o. male with a hx of resistant hypertension with stage III CKD mild aortic stenosis and resting sinus bradycardia last seen 06/24/2021. ? ?A renal vascular duplex performed 05/17/2021 for hypertension showing renal cyst bilaterally normal resistive index bilaterally normal kidney size and no finding of renal artery stenosis. ?His aldosterone renin level was normal with low renin activity ?  ?He had an outpatient EKG performed 05/06/2021 showing sinus bradycardia heart rate of 38 bpm. ?  ?He had been seen by Dr. Fletcher Anon 2012 with resistant hypertension and sinus  bradycardia.  At that time his heart rate was 43 bpm.  There is a notation that he had a treadmill stress test performed that was interpreted as negative. ?  ?Records from [facility] showed he is a creatinine 1.54 GFR 46 cc stage III CKD he has hypertension being treated with a thiazide diuretic and ACE inhibitor and a history of previous bradycardia with heart rates described as often in the 40s and occasionally down in the 30s felt to be asymptomatic and was not advised pacemaker therapy in the past. ?  ?Echocardiogram done 08/16/2020 showed moderate concentric LVH normal ejection fraction 55 to 60% he had grade 1 diastolic dysfunction with elevated filling pressures right ventricle is mildly dilated normal systolic function left atrium severely dilated right atrium moderately dilated he had mild valvular aortic stenosis pulmonary artery systolic pressure was normal 30 to 35 mmHg. ?  ?Compliance with diet, lifestyle and medications: Yes ? ?He does not check home blood pressure regularly ?Presented to a hospital in the range of 120/80 ?Tolerates his medications well is a little bit of peripheral edema from his alpha-blocker ?No palpitations syncope shortness of breath or chest pain. ?Recent labs at the [facility] 08/27/2021 cholesterol 78 A1c 5.0 creatinine 1.3 sodium 144 potassium 4.4 ?Past Medical History:  ?Diagnosis Date  ? Bradycardia, unspecified 09/20/2021  ? Closed fracture of left tibial plateau 11/06/2016  ? Formatting of this note might be different from the original. Added automatically from  request for surgery [id number]  ? Combined forms of age-related cataract, bilateral 09/20/2021  ? ED (erectile dysfunction)   ? Encounter for other preprocedural examination 09/20/2021  ? Encounter for surgical aftercare following surgery on the sense organs 09/20/2021  ? Erectile dysfunction due to arterial insufficiency 12/07/2017  ? H/O urinary retention 01/29/2018  ? HTN (hypertension)   ? Noncompliance with medication  regimen 09/20/2021  ? Obesity   ? s/p gastric bypass  ? Obesity 06/18/2021  ? s/p gastric bypass  ? Presence of intraocular lens 09/20/2021  ? Sinus bradycardia   ? Unilateral inguinal hernia, without obstruction or gangrene, not specified as recurrent 09/20/2021  ? Unspecified cataract 09/20/2021  ? ? ?Past Surgical History:  ?Procedure Laterality Date  ? FRACTURE SURGERY    ? 1966 and 2018/ Pin inserted  ? 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 HERNIA REPAIR  60's  ? PENILE PROSTHESIS PLACEMENT  01/27/2018  ? Procedure: Insertion of a 3 piece penile implant; Surgeon: Hillery Hunter, MD;Location: [facility] OR  ? TONSILLECTOMY    ? ? ?Current Medications: ?Current Meds  ?Medication Sig  ? b complex vitamins capsule Take 2 capsules by mouth every morning.  ? chlorthalidone (HYGROTON) 25 MG tablet Take 25 mg by mouth daily.  ? Cholecalciferol 50 MCG (2000 UT) TABS Take 1 tablet by mouth daily.  ? ferrous sulfate 325 (65 FE) MG tablet Take 325 mg by mouth every morning.  ? lisinopril (ZESTRIL) 20 MG tablet Take 20 mg by mouth daily.  ? Multiple Vitamin (MULTIVITAMIN) tablet Take 1 tablet by mouth daily.  ? Selenium 200 MCG CAPS Take 200 mcg by mouth 2 (two) times daily.  ? terazosin (HYTRIN) 2 MG capsule Take 2 mg by mouth at bedtime.  ? vitamin E 1000 UNIT capsule Take 1,000 Units by mouth daily.  ?  ? ?Allergies:   Amlodipine  ? ?Social History  ? ?Socioeconomic History  ? Marital status: Married  ?  Spouse name: Not on file  ? Number of children: Not on file  ? Years of education: Not on file  ? Highest education level: Not on file  ?Occupational History  ? Occupation: retired  ?Tobacco Use  ? Smoking status: Former  ?  Types: Cigarettes  ?  Quit date: 06/01/1976  ?  Years since quitting: 45.3  ?  Passive exposure: Past  ? Smokeless tobacco: Never  ?Vaping Use  ? Vaping Use: Never used  ?Substance and Sexual Activity  ? Alcohol use: No  ? Drug use: No  ? Sexual activity: Not on file  ?Other Topics Concern  ? Not on file  ?Social  History Narrative  ? Not on file  ? ?Social Determinants of Health  ? ?Financial Resource Strain: Not on file  ?Food Insecurity: Not on file  ?Transportation Needs: Not on file  ?Physical Activity: Not on file  ?Stress: Not on file  ?Social Connections: Not on file  ?  ? ?Family History: ?The patient's family history is not on file. ?ROS:   ?Please see the history of present illness.    ?All other systems reviewed and are negative. ? ?EKGs/Labs/Other Studies Reviewed:   ? ?The following studies were reviewed today: ? ?EKG:  EKG ordered today and personally reviewed.  The ekg ordered today demonstrates sinus bradycardia 42 beats per normal-normal PR interval QRS duration. ? ? ? ?Physical Exam:   ? ?VS:  BP (!) 170/76 (BP Location: Left Arm)   Pulse (!) 42  Ht '5\' 5"'$  (1.651 m)   Wt 148 lb 9.6 oz (67.4 kg)   SpO2 99%   BMI 24.73 kg/m?    ? ?Wt Readings from Last 3 Encounters:  ?09/24/21 148 lb 9.6 oz (67.4 kg)  ?05/08/21 140 lb (63.5 kg)  ?01/29/18 150 lb (68 kg)  ?  ? ?GEN:  Well nourished, well developed in no acute distress ?HEENT: Normal ?NECK: No JVD; No carotid bruits ?LYMPHATICS: No lymphadenopathy ?CARDIAC: RRR, no murmurs, rubs, gallops ?RESPIRATORY:  Clear to auscultation without rales, wheezing or rhonchi  ?ABDOMEN: Soft, non-tender, non-distended ?MUSCULOSKELETAL:  No edema; No deformity  ?SKIN: Warm and dry ?NEUROLOGIC:  Alert and oriented x 3 ?PSYCHIATRIC:  Normal affect  ? ? ?Signed, ?Shirlee More, MD  ?09/24/2021 9:43 AM    ?[place]  ?

## 2021-09-24 ENCOUNTER — Ambulatory Visit: Payer: Medicare HMO | Admitting: Cardiology

## 2021-09-24 ENCOUNTER — Encounter: Payer: Self-pay | Admitting: Cardiology

## 2021-09-24 VITALS — BP 170/76 | HR 42 | Ht 65.0 in | Wt 148.6 lb

## 2021-09-24 DIAGNOSIS — I1A0 Resistant hypertension: Secondary | ICD-10-CM

## 2021-09-24 DIAGNOSIS — R001 Bradycardia, unspecified: Secondary | ICD-10-CM

## 2021-09-24 DIAGNOSIS — I10 Essential (primary) hypertension: Secondary | ICD-10-CM | POA: Diagnosis not present

## 2021-09-24 DIAGNOSIS — N183 Chronic kidney disease, stage 3 unspecified: Secondary | ICD-10-CM

## 2021-09-24 DIAGNOSIS — I350 Nonrheumatic aortic (valve) stenosis: Secondary | ICD-10-CM | POA: Diagnosis not present

## 2021-09-24 NOTE — Patient Instructions (Signed)
Medication Instructions:  ?Your physician recommends that you continue on your current medications as directed. Please refer to the Current Medication list given to you today. ? ?*If you need a refill on your cardiac medications before your next appointment, please call your pharmacy* ? ? ?Lab Work: ?None ?If you have labs (blood work) drawn today and your tests are completely normal, you will receive your results only by: ?MyChart Message (if you have MyChart) OR ?A paper copy in the mail ?If you have any lab test that is abnormal or we need to change your treatment, we will call you to review the results. ? ? ?Testing/Procedures: ?Your physician has requested that you have an echocardiogram. Echocardiography is a painless test that uses sound waves to create images of your heart. It provides your doctor with information about the size and shape of your heart and how well your heart?s chambers and valves are working. This procedure takes approximately one hour. There are no restrictions for this procedure. ? ? ? ?Follow-Up: ?At [facility], you and your health needs are our priority.  As part of our continuing mission to provide you with exceptional heart care, we have created designated Provider Care Teams.  These Care Teams include your primary Cardiologist (physician) and Advanced Practice Providers (APPs -  Physician Assistants and Nurse Practitioners) who all work together to provide you with the care you need, when you need it. ? ?We recommend signing up for the patient portal called "MyChart".  Sign up information is provided on this After Visit Summary.  MyChart is used to connect with patients for Virtual Visits (Telemedicine).  Patients are able to view lab/test results, encounter notes, upcoming appointments, etc.  Non-urgent messages can be sent to your provider as well.   ?To learn more about what you can do with MyChart, go to [email or web address].   ? ?Your next appointment:   ?1 year(s) ? ?The  format for your next appointment:   ?In Person ? ?Provider:   ?Shirlee More, MD  ? ? ?Other Instructions ?Try OTC Pumpkin Seed Oil for prostrate  ? ?Purchase a FitBit or Apple Watch to monitor heart rate at home ? ?Check home blood pressure daily and record ? ?Important Information About Sugar ? ? ? ? ? ? ? ?Important Information About Sugar ? ? ? ? ? ? ? ? ?

## 2021-09-27 DIAGNOSIS — Z6823 Body mass index (BMI) 23.0-23.9, adult: Secondary | ICD-10-CM | POA: Diagnosis not present

## 2021-09-27 DIAGNOSIS — N400 Enlarged prostate without lower urinary tract symptoms: Secondary | ICD-10-CM | POA: Diagnosis not present

## 2021-10-17 ENCOUNTER — Ambulatory Visit (INDEPENDENT_AMBULATORY_CARE_PROVIDER_SITE_OTHER): Payer: Medicare HMO

## 2021-10-17 DIAGNOSIS — I10 Essential (primary) hypertension: Secondary | ICD-10-CM

## 2021-10-17 DIAGNOSIS — R001 Bradycardia, unspecified: Secondary | ICD-10-CM | POA: Diagnosis not present

## 2021-10-17 DIAGNOSIS — I1A0 Resistant hypertension: Secondary | ICD-10-CM

## 2021-10-17 DIAGNOSIS — I350 Nonrheumatic aortic (valve) stenosis: Secondary | ICD-10-CM

## 2021-10-17 DIAGNOSIS — N183 Chronic kidney disease, stage 3 unspecified: Secondary | ICD-10-CM | POA: Diagnosis not present

## 2021-10-17 LAB — ECHOCARDIOGRAM COMPLETE
AR max vel: 1 cm2
AV Area VTI: 1.18 cm2
AV Area mean vel: 0.94 cm2
AV Mean grad: 16.2 mmHg
AV Peak grad: 28.6 mmHg
Ao pk vel: 2.67 m/s
Area-P 1/2: 1.48 cm2
P 1/2 time: 730 msec
S' Lateral: 3.6 cm

## 2021-10-24 ENCOUNTER — Other Ambulatory Visit: Payer: Medicare HMO

## 2021-11-04 DIAGNOSIS — I2721 Secondary pulmonary arterial hypertension: Secondary | ICD-10-CM | POA: Diagnosis not present

## 2021-11-04 DIAGNOSIS — Z6821 Body mass index (BMI) 21.0-21.9, adult: Secondary | ICD-10-CM | POA: Diagnosis not present

## 2021-11-04 DIAGNOSIS — E785 Hyperlipidemia, unspecified: Secondary | ICD-10-CM | POA: Diagnosis not present

## 2021-11-04 DIAGNOSIS — N400 Enlarged prostate without lower urinary tract symptoms: Secondary | ICD-10-CM | POA: Diagnosis not present

## 2021-11-04 DIAGNOSIS — N529 Male erectile dysfunction, unspecified: Secondary | ICD-10-CM | POA: Diagnosis not present

## 2021-11-04 DIAGNOSIS — I10 Essential (primary) hypertension: Secondary | ICD-10-CM | POA: Diagnosis not present

## 2021-11-04 DIAGNOSIS — N183 Chronic kidney disease, stage 3 unspecified: Secondary | ICD-10-CM | POA: Diagnosis not present

## 2021-11-22 DIAGNOSIS — H6122 Impacted cerumen, left ear: Secondary | ICD-10-CM | POA: Diagnosis not present

## 2021-11-22 DIAGNOSIS — H6121 Impacted cerumen, right ear: Secondary | ICD-10-CM | POA: Diagnosis not present

## 2021-11-22 DIAGNOSIS — H6123 Impacted cerumen, bilateral: Secondary | ICD-10-CM | POA: Diagnosis not present

## 2021-11-26 DIAGNOSIS — M75101 Unspecified rotator cuff tear or rupture of right shoulder, not specified as traumatic: Secondary | ICD-10-CM | POA: Diagnosis not present

## 2021-11-26 DIAGNOSIS — M25511 Pain in right shoulder: Secondary | ICD-10-CM | POA: Diagnosis not present

## 2021-11-26 DIAGNOSIS — M19011 Primary osteoarthritis, right shoulder: Secondary | ICD-10-CM | POA: Diagnosis not present

## 2022-04-30 DIAGNOSIS — E785 Hyperlipidemia, unspecified: Secondary | ICD-10-CM | POA: Diagnosis not present

## 2022-04-30 DIAGNOSIS — I2721 Secondary pulmonary arterial hypertension: Secondary | ICD-10-CM | POA: Diagnosis not present

## 2022-04-30 DIAGNOSIS — R350 Frequency of micturition: Secondary | ICD-10-CM | POA: Diagnosis not present

## 2022-04-30 DIAGNOSIS — Z6823 Body mass index (BMI) 23.0-23.9, adult: Secondary | ICD-10-CM | POA: Diagnosis not present

## 2022-04-30 DIAGNOSIS — N183 Chronic kidney disease, stage 3 unspecified: Secondary | ICD-10-CM | POA: Diagnosis not present

## 2022-04-30 DIAGNOSIS — N400 Enlarged prostate without lower urinary tract symptoms: Secondary | ICD-10-CM | POA: Diagnosis not present

## 2022-06-13 DIAGNOSIS — N401 Enlarged prostate with lower urinary tract symptoms: Secondary | ICD-10-CM | POA: Diagnosis not present

## 2022-06-13 DIAGNOSIS — R351 Nocturia: Secondary | ICD-10-CM | POA: Diagnosis not present

## 2022-06-13 DIAGNOSIS — Z125 Encounter for screening for malignant neoplasm of prostate: Secondary | ICD-10-CM | POA: Diagnosis not present

## 2022-06-13 DIAGNOSIS — N3289 Other specified disorders of bladder: Secondary | ICD-10-CM | POA: Diagnosis not present

## 2022-06-19 ENCOUNTER — Telehealth: Payer: Self-pay | Admitting: Cardiology

## 2022-06-19 NOTE — Telephone Encounter (Signed)
Pt c/o BP issue: STAT if pt c/o blurred vision, one-sided weakness or slurred speech  1. What are your last 5 BP readings? 175/75; 185/82  2. Are you having any other symptoms (ex. Dizziness, headache, blurred vision, passed out)? no  3. What is your BP issue? Its becoming high

## 2022-06-19 NOTE — Telephone Encounter (Signed)
Spoke with pt who reports that his feet are swelling even after stopping Amlodipine 10 days ago. Pt states that he is gaining approximately 1 pound per day. Pt states that his BP is running 175-185/75-85 before and after his mediation. Please advise

## 2022-06-24 DIAGNOSIS — M7989 Other specified soft tissue disorders: Secondary | ICD-10-CM

## 2022-06-24 NOTE — Telephone Encounter (Signed)
Recommendations reviewed with pt as per Dr. Krasowski's note.  Pt verbalized understanding and had no additional questions.  

## 2022-06-25 LAB — BASIC METABOLIC PANEL WITH GFR
BUN/Creatinine Ratio: 26 — ABNORMAL HIGH (ref 10–24)
BUN: 46 mg/dL — ABNORMAL HIGH (ref 8–27)
CO2: 26 mmol/L (ref 20–29)
Calcium: 9 mg/dL (ref 8.6–10.2)
Chloride: 100 mmol/L (ref 96–106)
Creatinine, Ser: 1.75 mg/dL — ABNORMAL HIGH (ref 0.76–1.27)
Glucose: 105 mg/dL — ABNORMAL HIGH (ref 70–99)
Potassium: 3.8 mmol/L (ref 3.5–5.2)
Sodium: 140 mmol/L (ref 134–144)
eGFR: 40 mL/min/1.73 — ABNORMAL LOW

## 2022-06-25 LAB — PRO B NATRIURETIC PEPTIDE: NT-Pro BNP: 4466 pg/mL — ABNORMAL HIGH (ref 0–486)

## 2022-07-30 ENCOUNTER — Telehealth: Payer: Self-pay

## 2022-07-30 ENCOUNTER — Telehealth: Payer: Self-pay | Admitting: Cardiology

## 2022-07-30 MED ORDER — FUROSEMIDE 20 MG PO TABS: 20.0000 mg | ORAL_TABLET | Freq: Every day | ORAL | 3 refills | Status: DC

## 2022-07-30 NOTE — Telephone Encounter (Addendum)
Spoke with pt who stated that he called on 06-24-22 with swelling and weight gain. He was instructed to come in for lab work and has not heard back from it. He is still experiencing leg swelling and weight gain of 15-20lbs over the past 2 weeks. He denies any shortness of breath or pain. His blood pressures have been elevated at home in the 170-180 over 80-90 range. BMP, ProBNP from 06-24-22 are in the system.

## 2022-07-30 NOTE — Telephone Encounter (Signed)
Patient called and said that he has gained a lot of weight overnight and wanted to let Dr. Bettina Gavia and nurse know

## 2022-07-30 NOTE — Telephone Encounter (Signed)
LVM regarding message

## 2022-07-30 NOTE — Telephone Encounter (Signed)
Patient returning call.

## 2022-07-30 NOTE — Telephone Encounter (Signed)
Spoke with pt about message and Dr. Joya Gaskins recommendations. Will send Furosemide '20mg'$  to [facility] [address] per pt request. Encouraged to call if his symptoms are not improving after he starts the furosemide. Pt agreed and verbalized understanding.

## 2022-07-30 NOTE — Addendum Note (Signed)
Addended by: Jacobo Forest D on: 07/30/2022 12:07 PM   Modules accepted: Orders

## 2022-08-21 ENCOUNTER — Telehealth: Payer: Self-pay | Admitting: Cardiology

## 2022-08-21 NOTE — Telephone Encounter (Signed)
Pt c/o medication issue:  1. Name of Medication:   furosemide (LASIX) 20 MG tablet   2. How are you currently taking this medication (dosage and times per day)? As written  3. Are you having a reaction (difficulty breathing--STAT)? no  4. What is your medication issue?  Pt called in asking to speak to Dede, RN about this medication.

## 2022-08-21 NOTE — Telephone Encounter (Signed)
Left message for the patient to call back.

## 2022-08-22 ENCOUNTER — Other Ambulatory Visit: Payer: Self-pay

## 2022-08-22 NOTE — Telephone Encounter (Signed)
Called the patient and he reported that the swelling in his lower extremities was all gone and he wanted to know if he should continue taking the Lasix medication. I reviewed the medication list and informed him that the Lasix was prescribed as a daily medication and he should continue to take it on a daily basis. Patient was agreeable with that plan and had no further questions at this time.

## 2022-10-06 DIAGNOSIS — Z9181 History of falling: Secondary | ICD-10-CM | POA: Diagnosis not present

## 2022-10-06 DIAGNOSIS — Z0000 Encounter for general adult medical examination without abnormal findings: Secondary | ICD-10-CM | POA: Diagnosis not present

## 2022-10-30 ENCOUNTER — Ambulatory Visit: Payer: Medicare HMO | Attending: Cardiology | Admitting: Cardiology

## 2022-10-30 ENCOUNTER — Encounter: Payer: Self-pay | Admitting: Cardiology

## 2022-10-30 ENCOUNTER — Telehealth: Payer: Self-pay | Admitting: Cardiology

## 2022-10-30 ENCOUNTER — Ambulatory Visit: Payer: Medicare HMO | Attending: Cardiology

## 2022-10-30 VITALS — BP 178/88 | Ht 65.0 in | Wt 153.0 lb

## 2022-10-30 DIAGNOSIS — C4442 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: Secondary | ICD-10-CM

## 2022-10-30 DIAGNOSIS — E039 Hypothyroidism, unspecified: Secondary | ICD-10-CM | POA: Insufficient documentation

## 2022-10-30 DIAGNOSIS — N433 Hydrocele, unspecified: Secondary | ICD-10-CM | POA: Insufficient documentation

## 2022-10-30 DIAGNOSIS — R350 Frequency of micturition: Secondary | ICD-10-CM | POA: Diagnosis not present

## 2022-10-30 DIAGNOSIS — Z9884 Bariatric surgery status: Secondary | ICD-10-CM | POA: Insufficient documentation

## 2022-10-30 DIAGNOSIS — K649 Unspecified hemorrhoids: Secondary | ICD-10-CM

## 2022-10-30 DIAGNOSIS — I700 Atherosclerosis of aorta: Secondary | ICD-10-CM

## 2022-10-30 DIAGNOSIS — I77810 Thoracic aortic ectasia: Secondary | ICD-10-CM | POA: Diagnosis not present

## 2022-10-30 DIAGNOSIS — R001 Bradycardia, unspecified: Secondary | ICD-10-CM

## 2022-10-30 DIAGNOSIS — R030 Elevated blood-pressure reading, without diagnosis of hypertension: Secondary | ICD-10-CM

## 2022-10-30 DIAGNOSIS — N183 Chronic kidney disease, stage 3 unspecified: Secondary | ICD-10-CM

## 2022-10-30 DIAGNOSIS — G4700 Insomnia, unspecified: Secondary | ICD-10-CM | POA: Insufficient documentation

## 2022-10-30 DIAGNOSIS — N400 Enlarged prostate without lower urinary tract symptoms: Secondary | ICD-10-CM | POA: Diagnosis not present

## 2022-10-30 DIAGNOSIS — I350 Nonrheumatic aortic (valve) stenosis: Secondary | ICD-10-CM

## 2022-10-30 DIAGNOSIS — I10 Essential (primary) hypertension: Secondary | ICD-10-CM

## 2022-10-30 DIAGNOSIS — L570 Actinic keratosis: Secondary | ICD-10-CM | POA: Insufficient documentation

## 2022-10-30 DIAGNOSIS — I2721 Secondary pulmonary arterial hypertension: Secondary | ICD-10-CM | POA: Diagnosis not present

## 2022-10-30 DIAGNOSIS — R339 Retention of urine, unspecified: Secondary | ICD-10-CM

## 2022-10-30 DIAGNOSIS — N529 Male erectile dysfunction, unspecified: Secondary | ICD-10-CM | POA: Diagnosis not present

## 2022-10-30 DIAGNOSIS — E785 Hyperlipidemia, unspecified: Secondary | ICD-10-CM | POA: Diagnosis not present

## 2022-10-30 DIAGNOSIS — N401 Enlarged prostate with lower urinary tract symptoms: Secondary | ICD-10-CM | POA: Insufficient documentation

## 2022-10-30 DIAGNOSIS — Z6824 Body mass index (BMI) 24.0-24.9, adult: Secondary | ICD-10-CM | POA: Diagnosis not present

## 2022-10-30 HISTORY — DX: Elevated blood-pressure reading, without diagnosis of hypertension: R03.0

## 2022-10-30 HISTORY — DX: Retention of urine, unspecified: R33.9

## 2022-10-30 HISTORY — DX: Insomnia, unspecified: G47.00

## 2022-10-30 HISTORY — DX: Hypothyroidism, unspecified: E03.9

## 2022-10-30 HISTORY — DX: Unspecified hemorrhoids: K64.9

## 2022-10-30 HISTORY — DX: Benign prostatic hyperplasia with lower urinary tract symptoms: N40.1

## 2022-10-30 HISTORY — DX: Hydrocele, unspecified: N43.3

## 2022-10-30 HISTORY — DX: 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: C44.42

## 2022-10-30 HISTORY — DX: Bariatric surgery status: Z98.84

## 2022-10-30 HISTORY — DX: Actinic keratosis: L57.0

## 2022-10-30 NOTE — Patient Instructions (Signed)
Medication Instructions:  Your physician recommends that you continue on your current medications as directed. Please refer to the Current Medication list given to you today.  *If you need a refill on your cardiac medications before your next appointment, please call your pharmacy*   Lab Work: None ordered If you have labs (blood work) drawn today and your tests are completely normal, you will receive your results only by: MyChart Message (if you have MyChart) OR A paper copy in the mail If you have any lab test that is abnormal or we need to change your treatment, we will call you to review the results.   Testing/Procedures: A zio monitor was ordered today. It will remain on for 14 days. Remove 11/13/22. You will then return monitor and event diary in provided box. It takes 1-2 weeks for report to be downloaded and returned to [place]. We will call you with the results. If monitor falls off or has orange flashing light, please call [employer] for further instructions.   Follow-Up: At [facility], you and your health needs are our priority.  As part of our continuing mission to provide you with exceptional heart care, we have created designated Provider Care Teams.  These Care Teams include your primary Cardiologist (physician) and Advanced Practice Providers (APPs -  Physician Assistants and Nurse Practitioners) who all work together to provide you with the care you need, when you need it.  We recommend signing up for the patient portal called "MyChart".  Sign up information is provided on this After Visit Summary.  MyChart is used to connect with patients for Virtual Visits (Telemedicine).  Patients are able to view lab/test results, encounter notes, upcoming appointments, etc.  Non-urgent messages can be sent to your provider as well.   To learn more about what you can do with MyChart, go to [email or web address].    Your next appointment:   6 week(s)  The format for your next  appointment:   In Person  Provider:   Norman Herrlich, MD    Other Instructions none  Important Information About Sugar

## 2022-10-30 NOTE — Telephone Encounter (Signed)
Spoke with pts spouse Bonita Quin per [employer]- advised per [employer] note. Echo order entered.

## 2022-10-30 NOTE — Progress Notes (Signed)
Cardiology Office Note:    Date:  10/30/2022   ID:  Joshua Valenzuela, DOB 25-Jan-1946, MRN [id number]  PCP:  Wilmer Floor., MD   [facility] Providers Cardiologist:  Norman Herrlich, MD     Referring MD: Wilmer Floor., MD   CC: follow up bradycardia  History of Present Illness:    Joshua Valenzuela is a 77 y.o. male with a hx of aortic atherosclerosis noted on CT imaging, hypertension, CKD stage III, erectile dysfunction, obesity, bradycardia, s/p gastric bypass.  Renal artery ultrasound on 05/17/2021 without stenosis.  Most recently evaluated by Dr. Dulce Sellar on 09/24/2021, he was doing well from a cardiac perspective.  He was encouraged to monitor his heart rate at home for bradycardic episodes.  Echo on 10/17/2021 revealed an EF 50 to 55%, mild concentric LVH, grade 1 DD, LA severely dilated, RA mildly dilated, trivial MR, mild calcification of the aortic valve, mild AR with mild stenosis, mild dilatation of ascending aorta at 40 mm.  He presents today at the behest of his PCP after presenting to their office for routine follow-up.  He was noted to be bradycardic and hypertensive, his office called to request that he be evaluated.  He is asymptomatic, his heart rate in the office today is 35 bpm.  His blood pressure is elevated at 200/90.  He continues to be completely asymptomatic with his bradycardia.  He typically checks his blood pressure at home and states that it is not typically this high, his blood pressure correlates with our readings in the office and he noticed his blood pressure reading at home today was elevated as well.  He states typically he has an occasional spike in his blood pressure that he cannot correlate with anything.  He does have pedal edema however states it is much better since he has been taking Lasix.  He denies chest pain, palpitations, dyspnea, pnd, orthopnea, n, v, dizziness, syncope,  weight gain, or early satiety.    Past Medical History:   Diagnosis Date   Aortic atherosclerosis (HCC)    Noted on CT of chest   Bradycardia, unspecified 09/20/2021   Closed fracture of left tibial plateau 11/06/2016   Formatting of this note might be different from the original. Added automatically from request for surgery [id number]   Combined forms of age-related cataract, bilateral 09/20/2021   ED (erectile dysfunction)    Encounter for other preprocedural examination 09/20/2021   Encounter for surgical aftercare following surgery on the sense organs 09/20/2021   Erectile dysfunction due to arterial insufficiency 12/07/2017   H/O urinary retention 01/29/2018   HTN (hypertension)    Noncompliance with medication regimen 09/20/2021   Obesity    s/p gastric bypass   Obesity 06/18/2021   s/p gastric bypass   Presence of intraocular lens 09/20/2021   Sinus bradycardia    Unilateral inguinal hernia, without obstruction or gangrene, not specified as recurrent 09/20/2021   Unspecified cataract 09/20/2021    Past Surgical History:  Procedure Laterality Date   FRACTURE SURGERY     1966 and 2018/ Pin inserted   GASTRIC BYPASS  06/02/2002   HERNIA REPAIR  60's   PENILE PROSTHESIS PLACEMENT  01/27/2018   Procedure: Insertion of a 3 piece penile implant; Surgeon: Queen Slough, MD;Location: [facility] OR   TONSILLECTOMY      Current Medications: Current Meds  Medication Sig   b complex vitamins capsule Take 2 capsules by mouth every morning.   chlorthalidone (HYGROTON) 25 MG tablet Take  25 mg by mouth daily.   Cholecalciferol 50 MCG (2000 UT) TABS Take 1 tablet by mouth daily.   ferrous sulfate 325 (65 FE) MG tablet Take 325 mg by mouth every morning.   furosemide (LASIX) 20 MG tablet Take 1 tablet (20 mg total) by mouth daily.   lisinopril (ZESTRIL) 20 MG tablet Take 20 mg by mouth daily.   Multiple Vitamin (MULTIVITAMIN) tablet Take 1 tablet by mouth daily.   Selenium 200 MCG CAPS Take 200 mcg by mouth 2 (two) times daily.   terazosin  (HYTRIN) 2 MG capsule Take 2 mg by mouth at bedtime.   vitamin E 1000 UNIT capsule Take 1,000 Units by mouth daily.     Allergies:   Amlodipine   Social History   Socioeconomic History   Marital status: Married    Spouse name: Not on file   Number of children: Not on file   Years of education: Not on file   Highest education level: Not on file  Occupational History   Occupation: retired  Tobacco Use   Smoking status: Former    Types: Cigarettes    Quit date: 06/01/1976    Years since quitting: 46.4    Passive exposure: Past   Smokeless tobacco: Never  Vaping Use   Vaping Use: Never used  Substance and Sexual Activity   Alcohol use: No   Drug use: No   Sexual activity: Not on file  Other Topics Concern   Not on file  Social History Narrative   Not on file   Social Determinants of Health   Financial Resource Strain: Not on file  Food Insecurity: Not on file  Transportation Needs: Not on file  Physical Activity: Not on file  Stress: Not on file  Social Connections: Not on file     Family History: The patient's family history is not on file.  ROS:   Please see the history of present illness.     All other systems reviewed and are negative.  EKGs/Labs/Other Studies Reviewed:    The following studies were reviewed today: Cardiac Studies & Procedures       ECHOCARDIOGRAM  ECHOCARDIOGRAM COMPLETE 10/17/2021  Narrative ECHOCARDIOGRAM REPORT    Patient Name:   Joshua Valenzuela Date of Exam: 10/17/2021 Medical Rec #:  [id number]     Height:       65.0 in Accession #:    [id number]    Weight:       148.6 lb Date of Birth:  [birth year]     BSA:          1.744 m Patient Age:    75 years      BP:           170/76 mmHg Patient Gender: M             HR:           42 bpm. 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 Arrythmias:Bradycardia; Risk Factors:Hypertension.  Sonographer:    Margreta Journey RDCS Referring Phys: [id number] BRIAN J Dulce Sellar   Sonographer Comments: Global longitudinal strain was attempted. IMPRESSIONS   1. Left ventricular ejection fraction, by estimation,  is 50 to 55%. The left ventricle has low normal function. The left ventricle has no regional wall motion abnormalities. There is mild concentric left ventricular hypertrophy. Left ventricular diastolic parameters are consistent with Grade I diastolic dysfunction (impaired relaxation). Elevated left ventricular end-diastolic pressure. 2. Right ventricular systolic function is normal. The right ventricular size is mildly enlarged. There is mildly elevated pulmonary artery systolic pressure. 3. Left atrial size was severely dilated. 4. Right atrial size was mildly dilated. 5. The mitral valve is normal in structure. Trivial mitral valve regurgitation. No evidence of mitral stenosis. 6. The aortic valve is tricuspid. There is mild calcification of the aortic valve. There is mild thickening of the aortic valve. Aortic valve regurgitation is mild. Mild aortic valve stenosis. 7. Aortic Normal DTA. There is mild dilatation of the ascending aorta, measuring 40 mm. 8. The inferior vena cava is dilated in size with >50% respiratory variability, suggesting right atrial pressure of 8 mmHg.  FINDINGS Left Ventricle: Left ventricular ejection fraction, by estimation, is 50 to 55%. The left ventricle has low normal function. The left ventricle has no regional wall motion abnormalities. The left ventricular internal cavity size was normal in size. There is mild concentric left ventricular hypertrophy. Left ventricular diastolic  parameters are consistent with Grade I diastolic dysfunction (impaired relaxation). Elevated left ventricular end-diastolic pressure.  Right Ventricle: The right ventricular size is mildly enlarged. No increase in right ventricular wall thickness. Right ventricular systolic function is normal. There is mildly elevated pulmonary artery systolic pressure. The tricuspid regurgitant velocity is 2.81 m/s, and with an assumed right atrial pressure of 8 mmHg, the estimated right ventricular systolic pressure is 39.6 mmHg.  Left Atrium: Left atrial size was severely dilated.  Right Atrium: Right atrial size was mildly dilated.  Pericardium: There is no evidence of pericardial effusion.  Mitral Valve: The mitral valve is normal in structure. Trivial mitral valve regurgitation. No evidence of mitral valve stenosis.  Tricuspid Valve: The tricuspid valve is normal in structure. Tricuspid valve regurgitation is mild . No evidence of tricuspid stenosis.  Aortic Valve: The aortic valve is tricuspid. There is mild calcification of the aortic valve. There is mild thickening of the aortic valve. Aortic valve regurgitation is mild. Aortic regurgitation PHT measures 730 msec. Mild aortic stenosis is present. Aortic valve mean gradient measures 16.2 mmHg. Aortic valve peak gradient measures 28.6 mmHg. Aortic valve area, by VTI measures 1.18 cm.  Pulmonic Valve: The pulmonic valve was normal in structure. Pulmonic valve regurgitation is not visualized. No evidence of pulmonic stenosis.  Aorta: The aortic root is normal in size and structure, the aortic arch was not well visualized and Normal DTA. There is mild dilatation of the ascending aorta, measuring 40 mm.  Venous: The pulmonary veins were not well visualized. The inferior vena cava is dilated in size with greater than 50% respiratory variability, suggesting right atrial pressure of 8 mmHg.  IAS/Shunts: No atrial level shunt detected by color flow  Doppler.   LEFT VENTRICLE PLAX 2D LVIDd:         4.80 cm   Diastology LVIDs:         3.60 cm   LV e' medial:    2.61 cm/s LV PW:         1.20 cm   LV E/e' medial:  24.6 LV IVS:        1.60 cm   LV e' lateral:   3.59 cm/s LVOT diam:     1.90 cm  LV E/e' lateral: 17.9 LV SV:         80 LV SV Index:   46 LVOT Area:     2.84 cm   RIGHT VENTRICLE             IVC RV Basal diam:  4.50 cm     IVC diam: 2.50 cm RV Mid diam:    2.90 cm RV S prime:     21.10 cm/s TAPSE (M-mode): 3.2 cm  LEFT ATRIUM              Index        RIGHT ATRIUM           Index LA diam:        4.30 cm  2.47 cm/m   RA Area:     19.50 cm LA Vol (A2C):   101.0 ml 57.93 ml/m  RA Volume:   65.10 ml  37.34 ml/m LA Vol (A4C):   64.8 ml  37.17 ml/m LA Biplane Vol: 86.4 ml  49.55 ml/m AORTIC VALVE AV Area (Vmax):    1.00 cm AV Area (Vmean):   0.94 cm AV Area (VTI):     1.18 cm AV Vmax:           267.40 cm/s AV Vmean:          189.000 cm/s AV VTI:            0.680 m AV Peak Grad:      28.6 mmHg AV Mean Grad:      16.2 mmHg LVOT Vmax:         94.17 cm/s LVOT Vmean:        62.467 cm/s LVOT VTI:          0.283 m LVOT/AV VTI ratio: 0.42 AI PHT:            730 msec  AORTA Ao Root diam: 3.80 cm Ao Asc diam:  4.00 cm Ao Desc diam: 2.20 cm  MITRAL VALVE                TRICUSPID VALVE MV Area (PHT): 1.48 cm     TR Peak grad:   31.6 mmHg MV Decel Time: 512 msec     TR Vmax:        281.00 cm/s MV E velocity: 64.30 cm/s MV A velocity: 107.00 cm/s  SHUNTS MV E/A ratio:  0.60         Systemic VTI:  0.28 m Systemic Diam: 1.90 cm  Norman Herrlich MD Electronically signed by Norman Herrlich MD Signature Date/Time: 10/17/2021/12:35:37 PM    Final              EKG:  EKG is  ordered today.  The ekg ordered today demonstrates sinus bradycardia with PACs, heart rate 35 bpm.  Recent Labs: 06/24/2022: BUN 46; Creatinine, Ser 1.75; NT-Pro BNP 4,466; Potassium 3.8; Sodium 140  Recent Lipid Panel No results found  for: "CHOL", "TRIG", "HDL", "CHOLHDL", "VLDL", "LDLCALC", "LDLDIRECT"   Risk Assessment/Calculations:      HYPERTENSION CONTROL Vitals:   10/30/22 0949 10/30/22 1031  BP: (!) 200/90 (!) 178/88    The patient's blood pressure is elevated above target today.  In order to address the patient's elevated BP: Blood pressure will be monitored at home to determine if medication changes need to be made.            Physical Exam:    VS:  BP (!) 178/88   Ht 5'  5" (1.651 m)   Wt 153 lb (69.4 kg)   SpO2 (!) 35%   BMI 25.46 kg/m     Wt Readings from Last 3 Encounters:  10/30/22 153 lb (69.4 kg)  09/24/21 148 lb 9.6 oz (67.4 kg)  05/08/21 140 lb (63.5 kg)     GEN:  Well nourished, well developed in no acute distress HEENT: Normal NECK: No JVD; No carotid bruits LYMPHATICS: No lymphadenopathy CARDIAC: slow but regular, +2/6 systolic murmurs, no rubs, no gallops RESPIRATORY:  Clear to auscultation without rales, wheezing or rhonchi  ABDOMEN: Soft, non-tender, non-distended MUSCULOSKELETAL:  +2 pitting edema,  No deformity  SKIN: Warm and dry NEUROLOGIC:  Alert and oriented x 3 PSYCHIATRIC:  Normal affect   ASSESSMENT:    1. Sinus bradycardia   2. Aortic atherosclerosis (HCC)   3. Essential hypertension   4. Stage 3 chronic kidney disease, unspecified whether stage 3a or 3b CKD (HCC)   5. Ascending aorta dilatation (HCC)    PLAN:    In order of problems listed above:  Sinus bradycardia-this has been persistent dating back to at least 2012.  Review of his heart rate available in epic, revealed his heart rate has been in the 30s on several occasions.  He is completely asymptomatic.  Blood work was checked today by his PCP, we will request results from their office this.  Will have him wear a 14-day monitor to assess for chronotropic incompetence.  Will refer him to EP in [place].  Aortic atherosclerosis-noted on CT imaging, Stable with no anginal symptoms. No indication for  ischemic evaluation.  Will start him on ASA 81 mg daily.  Hypertension-blood pressure is elevated in the office today at 178/88, he had taken his medications this morning.  His blood pressure readings at home are typically in the 130-70 systolic and correlate with office readings.  Occasionally he states he has an elevated reading " here and there".  He is advised to continue to check his blood pressure at home, if his blood pressure readings are persistently greater than 140, will increase his lisinopril.  Ascending aorta dilatation-noted on echocardiogram, discussed further surveillance.  Will arrange at next appointment. Aortic stenosis-mild per most recent echo in 2023.  Will repeat echocardiogram.  Disposition - start ASA 81 mg daily, 14 day zio, request labs from PCP, refer to EP in [place]. Return in 6 weeks.            Medication Adjustments/Labs and Tests Ordered: Current medicines are reviewed at length with the patient today.  Concerns regarding medicines are outlined above.  Orders Placed This Encounter  Procedures   Ambulatory referral to Cardiac Electrophysiology   LONG TERM MONITOR (3-14 DAYS)   EKG 12-Lead   No orders of the defined types were placed in this encounter.   Patient Instructions  Medication Instructions:  Your physician recommends that you continue on your current medications as directed. Please refer to the Current Medication list given to you today.  *If you need a refill on your cardiac medications before your next appointment, please call your pharmacy*   Lab Work: None ordered If you have labs (blood work) drawn today and your tests are completely normal, you will receive your results only by: MyChart Message (if you have MyChart) OR A paper copy in the mail If you have any lab test that is abnormal or we need to change your treatment, we will call you to review the results.   Testing/Procedures: A zio monitor  was ordered today. It will remain  on for 14 days. Remove 11/13/22. You will then return monitor and event diary in provided box. It takes 1-2 weeks for report to be downloaded and returned to [place]. We will call you with the results. If monitor falls off or has orange flashing light, please call Z[employer] for further instructions.   Follow-Up: At [facility], you and your health needs are our priority.  As part of our continuing mission to provide you with exceptional heart care, we have created designated Provider Care Teams.  These Care Teams include your primary Cardiologist (physician) and Advanced Practice Providers (APPs -  Physician Assistants and Nurse Practitioners) who all work together to provide you with the care you need, when you need it.  We recommend signing up for the patient portal called "MyChart".  Sign up information is provided on this After Visit Summary.  MyChart is used to connect with patients for Virtual Visits (Telemedicine).  Patients are able to view lab/test results, encounter notes, upcoming appointments, etc.  Non-urgent messages can be sent to your provider as well.   To learn more about what you can do with MyChart, go to [email or web address].    Your next appointment:   6 week(s)  The format for your next appointment:   In Person  Provider:   Norman Herrlich, MD    Other Instructions none  Important Information About Sugar        Signed, Flossie Dibble, NP  10/30/2022 10:32 AM    [facility]

## 2022-11-06 ENCOUNTER — Other Ambulatory Visit: Payer: Self-pay

## 2022-11-06 MED ORDER — FUROSEMIDE 20 MG PO TABS: 20.0000 mg | ORAL_TABLET | Freq: Every day | ORAL | 2 refills | Status: DC

## 2022-11-18 ENCOUNTER — Telehealth: Payer: Self-pay | Admitting: Cardiology

## 2022-11-18 DIAGNOSIS — R001 Bradycardia, unspecified: Secondary | ICD-10-CM | POA: Diagnosis not present

## 2022-11-18 NOTE — Telephone Encounter (Signed)
Zio by Meredeth Ide called to get abnormal zio results for patient

## 2022-11-18 NOTE — Telephone Encounter (Signed)
Spoke with [employer] who reports that the pt had an episode of bradycardia rate 28 lasting for 1 min on 11/10/22 at 5:36 am. Strip can be found on page 10 strip 1.

## 2022-11-20 ENCOUNTER — Encounter: Payer: Self-pay | Admitting: Cardiology

## 2022-11-20 NOTE — Telephone Encounter (Signed)
Called patient and he reported that he was feeling fine and that he was asymptomatic with the bradycardia. He also asked about the results of his heart monitor that he turned in on 11/11/22. I did not see where they had been interpreted. Please advise.

## 2022-11-26 NOTE — Telephone Encounter (Signed)
Patient informed of monitor results and had no further questions at this time.

## 2022-11-27 ENCOUNTER — Telehealth: Payer: Self-pay | Admitting: Cardiology

## 2022-11-27 NOTE — Telephone Encounter (Signed)
Pt reports that the past several days he has been woke up around 0400-0500 with shortness of breath where he feels he needs to take several deep breaths. Pt reports that he is unable to sleep after this. Pt does states that he does not have the shortness of breath with exertion.

## 2022-11-27 NOTE — Telephone Encounter (Signed)
Left vm to call back

## 2022-11-27 NOTE — Telephone Encounter (Signed)
Recommendations reviewed with pt as per Dr. Munley's note.  Pt verbalized understanding and had no additional questions.  

## 2022-11-27 NOTE — Telephone Encounter (Signed)
Pt c/o Shortness Of Breath: STAT if SOB developed within the last 24 hours or pt is noticeably SOB on the phone  1. Are you currently SOB (can you hear that pt is SOB on the phone)? No   2. How long have you been experiencing SOB? States for about a month in the morning time. States it wakes him up.   3. Are you SOB when sitting or when up moving around? When trying to sleep   4. Are you currently experiencing any other symptoms? No.

## 2022-12-02 ENCOUNTER — Ambulatory Visit: Payer: Medicare HMO | Attending: Cardiology

## 2022-12-02 DIAGNOSIS — I350 Nonrheumatic aortic (valve) stenosis: Secondary | ICD-10-CM | POA: Diagnosis not present

## 2022-12-02 LAB — ECHOCARDIOGRAM COMPLETE
AR max vel: 1.14 cm2
AV Area VTI: 1.16 cm2
AV Area mean vel: 1.06 cm2
AV Mean grad: 18.4 mmHg
AV Peak grad: 33.1 mmHg
Ao pk vel: 2.88 m/s
Area-P 1/2: 3.89 cm2
MV M vel: 5.01 m/s
MV Peak grad: 100.4 mmHg
P 1/2 time: 977 ms
S' Lateral: 4.6 cm

## 2022-12-18 NOTE — Progress Notes (Signed)
Cardiology Office Note:    Date:  12/19/2022   ID:  Joshua Valenzuela, DOB 1945/12/06, MRN [id number]  PCP:  Wilmer Floor., MD  Cardiologist:  Norman Herrlich, MD    Referring MD: Wilmer Floor., MD    ASSESSMENT:    1. SOB (shortness of breath)   2. Resistant hypertension   3. Stage 3 chronic kidney disease, unspecified whether stage 3a or 3b CKD (HCC)   4. Nonrheumatic aortic valve stenosis   5. Sinus bradycardia    PLAN:    In order of problems listed above:  He has made a clinical clues to sleep apnea either central or obstructive and would benefit from evaluation which was ordered In the interim continue his loop diuretic will help with his hypertension He relates recent labs show improved kidney function Valvular heart disease has not progressed any an echocardiogram in 1 year His nocturnal bradycardia may be a reflection of sleep apnea with a central obstructive another reason to do the sleep study   Next appointment: 3 months   Medication Adjustments/Labs and Tests Ordered: Current medicines are reviewed at length with the patient today.  Concerns regarding medicines are outlined above.  No orders of the defined types were placed in this encounter.  No orders of the defined types were placed in this encounter.    History of Present Illness:    Joshua Valenzuela is a 77 y.o. male with a hx of resistant hypertension with stage III CKD sinus bradycardia and mild aortic stenosis last seen 09/25/2022.  Echocardiogram following that visit showed low normal ejection fraction mild LVH grade 1 diastolic filling.  Right ventricle was mildly enlarged normal systolic function mildly elevated pulm artery pressure right atrium is mildly enlarged left atrium severely enlarged and there is mild aortic valve stenosis.  An event monitor reported 11/19/2022 showing sinus rhythm with marked sinus bradycardia nocturnally without pauses of 3 seconds or greater or third-degree AV block  and there are brief episodes of idioventricular rhythm.  Most recently had a phone call which shortness of breath diuretic was increased and a repeat echocardiogram showed mild to moderate aortic stenosis mean gradient 18 mmHg mild to moderate mitral regurgitation. Compliance with diet, lifestyle and medications: Yes I reviewed the results of his testing with him With a low-dose loop diuretic his edema has cleared He previously had morbid obesity with bariatric surgery and lost greater than 100 pounds in the first year He has a background history of snoring and now sleeps in a separate room from his spouse He has poor sleep hygiene and awakens frequently during the night with urinary frequency He also has restless leg syndrome He has nocturnal bradycardia strongly suggestive of sleep apnea In the early morning hours when he awakens he feels short of breath he gets up to his activities his symptoms resolved he has no exertional shortness of breath orthopnea chest pain palpitation or syncope His home blood pressure at times runs greater than 140 systolic Past Medical History:  Diagnosis Date   Aortic atherosclerosis (HCC)    Noted on CT of chest   Bradycardia, unspecified 09/20/2021   Closed fracture of left tibial plateau 11/06/2016   Formatting of this note might be different from the original. Added automatically from request for surgery [id number]   Combined forms of age-related cataract, bilateral 09/20/2021   ED (erectile dysfunction)    Encounter for other preprocedural examination 09/20/2021   Encounter for surgical aftercare following surgery on the sense organs 09/20/2021  Erectile dysfunction due to arterial insufficiency 12/07/2017   H/O urinary retention 01/29/2018   HTN (hypertension)    Noncompliance with medication regimen 09/20/2021   Obesity    s/p gastric bypass   Obesity 06/18/2021   s/p gastric bypass   Presence of intraocular lens 09/20/2021   Sinus bradycardia     Unilateral inguinal hernia, without obstruction or gangrene, not specified as recurrent 09/20/2021   Unspecified cataract 09/20/2021    Current Medications: Current Meds  Medication Sig   b complex vitamins capsule Take 2 capsules by mouth every morning.   chlorthalidone (HYGROTON) 25 MG tablet Take 25 mg by mouth daily.   Cholecalciferol 50 MCG (2000 UT) TABS Take 1 tablet by mouth daily.   clonazePAM (KLONOPIN) 0.5 MG tablet Take 0.5 mg by mouth at bedtime.   diclofenac Sodium (VOLTAREN) 1 % GEL Apply 2 g topically daily as needed (joint pain).   ferrous sulfate 325 (65 FE) MG tablet Take 325 mg by mouth every morning.   furosemide (LASIX) 20 MG tablet Take 1 tablet (20 mg total) by mouth daily.   hydrALAZINE (APRESOLINE) 25 MG tablet Take 25 mg by mouth at bedtime.   levothyroxine (SYNTHROID) 75 MCG tablet Take 75 mcg by mouth daily before breakfast.   lisinopril (ZESTRIL) 20 MG tablet Take 20 mg by mouth daily.   Multiple Vitamin (MULTIVITAMIN) tablet Take 1 tablet by mouth daily.   rOPINIRole (REQUIP) 0.5 MG tablet Take 0.5 mg by mouth at bedtime.   Selenium 200 MCG CAPS Take 200 mcg by mouth 2 (two) times daily.   tamsulosin (FLOMAX) 0.4 MG CAPS capsule Take 0.4 mg by mouth 2 (two) times daily.   terazosin (HYTRIN) 2 MG capsule Take 2 mg by mouth at bedtime.   vitamin E 1000 UNIT capsule Take 1,000 Units by mouth daily.      EKGs/Labs/Other Studies Reviewed:    The following studies were reviewed today:  Cardiac Studies & Procedures       ECHOCARDIOGRAM  ECHOCARDIOGRAM COMPLETE 12/02/2022  Narrative ECHOCARDIOGRAM REPORT    Patient Name:   Joshua Valenzuela Date of Exam: 12/02/2022 Medical Rec #:  [id number]     Height:       65.0 in Accession #:    [id number]    Weight:       153.0 lb Date of Birth:  Jul 25, 1945     BSA:          1.765 m Patient Age:    76 years      BP:           178/88 mmHg Patient Gender: M             HR:           33 bpm. Exam Location:   [facility]  Procedure: 2D Echo, Cardiac Doppler, Color Doppler and Strain Analysis  Indications:    Nonrheumatic aortic valve stenosis [I35.0 (ICD-10-CM)  History:        Patient has prior history of Echocardiogram examinations, most recent 10/17/2021. Aortic Valve Disease, Arrythmias:Bradycardia; Risk Factors:Former Smoker and Hypertension.  Sonographer:    Margreta Journey RDCS Referring Phys: [phone] JENNIFER C WOODY  IMPRESSIONS   1. Left ventricular ejection fraction, by estimation, is 50 to 55%. The left ventricle has low normal function. The left ventricle has no regional wall motion abnormalities. The left ventricular internal cavity size was mildly dilated. Left ventricular diastolic parameters are indeterminate. The average left ventricular global longitudinal strain is -16.7 %.  The global longitudinal strain is abnormal. 2. Right ventricular systolic function is normal. The right ventricular size is normal. 3. Left atrial size was severely dilated. 4. Right atrial size was moderately dilated. 5. The mitral valve is normal in structure. Mild to moderate mitral valve regurgitation. No evidence of mitral stenosis. 6. The aortic valve is calcified. There is mild calcification of the aortic valve. There is mild thickening of the aortic valve. Aortic valve regurgitation is trivial. Mild to moderate aortic valve stenosis. Aortic valve area, by VTI measures 1.16 cm. Aortic valve mean gradient measures 18.4 mmHg. 7. The inferior vena cava is normal in size with greater than 50% respiratory variability, suggesting right atrial pressure of 3 mmHg.  FINDINGS Left Ventricle: Left ventricular ejection fraction, by estimation, is 50 to 55%. The left ventricle has low normal function. The left ventricle has no regional wall motion abnormalities. The average left ventricular global longitudinal strain is -16.7 %. The global longitudinal strain is abnormal. The left ventricular internal cavity size  was mildly dilated. There is no left ventricular hypertrophy. Left ventricular diastolic parameters are indeterminate.  Right Ventricle: The right ventricular size is normal. No increase in right ventricular wall thickness. Right ventricular systolic function is normal.  Left Atrium: Left atrial size was severely dilated.  Right Atrium: Right atrial size was moderately dilated.  Pericardium: There is no evidence of pericardial effusion.  Mitral Valve: The mitral valve is normal in structure. Mild to moderate mitral valve regurgitation. No evidence of mitral valve stenosis.  Tricuspid Valve: The tricuspid valve is normal in structure. Tricuspid valve regurgitation is not demonstrated. No evidence of tricuspid stenosis.  Aortic Valve: The aortic valve is calcified. There is mild calcification of the aortic valve. There is mild thickening of the aortic valve. Aortic valve regurgitation is trivial. Aortic regurgitation PHT measures 977 msec. Mild to moderate aortic stenosis is present. Aortic valve mean gradient measures 18.4 mmHg. Aortic valve peak gradient measures 33.1 mmHg. Aortic valve area, by VTI measures 1.16 cm.  Pulmonic Valve: The pulmonic valve was normal in structure. Pulmonic valve regurgitation is trivial. No evidence of pulmonic stenosis.  Aorta: The aortic root is normal in size and structure.  Venous: The inferior vena cava is normal in size with greater than 50% respiratory variability, suggesting right atrial pressure of 3 mmHg.  IAS/Shunts: No atrial level shunt detected by color flow Doppler.   LEFT VENTRICLE PLAX 2D LVIDd:         6.30 cm   Diastology LVIDs:         4.60 cm   LV e' medial:    4.57 cm/s LV PW:         0.90 cm   LV E/e' medial:  18.4 LV IVS:        1.10 cm   LV e' lateral:   3.48 cm/s LVOT diam:     2.20 cm   LV E/e' lateral: 24.1 LV SV:         94 LV SV Index:   53        2D Longitudinal Strain LVOT Area:     3.80 cm  2D Strain GLS Avg:      -16.7 %   RIGHT VENTRICLE             IVC RV Basal diam:  5.00 cm     IVC diam: 2.70 cm RV Mid diam:    3.90 cm RV S prime:     13.10 cm/s  TAPSE (M-mode): 3.3 cm  LEFT ATRIUM              Index        RIGHT ATRIUM           Index LA diam:        5.50 cm  3.12 cm/m   RA Area:     22.90 cm LA Vol (A2C):   155.0 ml 87.81 ml/m  RA Volume:   85.10 ml  48.21 ml/m LA Vol (A4C):   99.5 ml  56.37 ml/m LA Biplane Vol: 125.0 ml 70.81 ml/m AORTIC VALVE                     PULMONIC VALVE AV Area (Vmax):    1.14 cm      PR End Diast Vel: 2.06 msec AV Area (Vmean):   1.06 cm AV Area (VTI):     1.16 cm AV Vmax:           287.60 cm/s AV Vmean:          199.800 cm/s AV VTI:            0.808 m AV Peak Grad:      33.1 mmHg AV Mean Grad:      18.4 mmHg LVOT Vmax:         86.25 cm/s LVOT Vmean:        55.700 cm/s LVOT VTI:          0.246 m LVOT/AV VTI ratio: 0.31 AI PHT:            977 msec  AORTA Ao Root diam: 3.50 cm Ao Asc diam:  3.40 cm Ao Desc diam: 2.60 cm  MITRAL VALVE               TRICUSPID VALVE MV Area (PHT): 3.89 cm    TR Peak grad:   21.0 mmHg MV Decel Time: 195 msec    TR Vmax:        229.00 cm/s MR Peak grad: 100.4 mmHg MR Mean grad: 73.0 mmHg    SHUNTS MR Vmax:      501.00 cm/s  Systemic VTI:  0.25 m MR Vmean:     407.0 cm/s   Systemic Diam: 2.20 cm MV E velocity: 83.90 cm/s MV A velocity: 79.80 cm/s MV E/A ratio:  1.05  Gypsy Balsam MD Electronically signed by Gypsy Balsam MD Signature Date/Time: 12/02/2022/2:33:11 PM    Final    MONITORS  LONG TERM MONITOR (3-14 DAYS) 11/19/2022  Narrative Patch Wear Time:  13 days and 20 hours (2024-05-30T10:35:00-0400 to 2024-06-13T07:17:37-0400)  Patient had a min HR of 27 bpm, max HR of 133 bpm, and avg HR of 43 bpm. Predominant underlying rhythm was Sinus Rhythm. Bundle Branch Block/IVCD was present.  There were no triggered or diary events.  Marked sinus bradycardia seen nocturnally.  There are no  episodes of second or third-degree AV nodal block.  There were no pauses of 3 seconds or greater.  The brief episode of idioventricular rhythm was associated with marked nocturnal sinus bradycardia.  15 Supraventricular Tachycardia runs occurred, the run with the fastest interval lasting 6 beats with a max rate of 133 bpm, the longest lasting 13 beats with an avg rate of 92 bpm.  Idioventricular Rhythm was present.  Isolated SVEs were occasional (3.1%, 25511), SVE Couplets were rare (<1.0%, 172), and SVE Triplets were rare (<1.0%, 42).  Isolated VEs were rare (<1.0%), VE Couplets were rare (<  1.0%), and no VE Triplets were present. Ventricular Bigeminy and Trigeminy were present. MD notification criteria for Bradycardia met - report posted prior to notification per account request (MS).               Recent Labs: 06/24/2022: BUN 46; Creatinine, Ser 1.75; NT-Pro BNP 4,466; Potassium 3.8; Sodium 140  Recent Lipid Panel No results found for: "CHOL", "TRIG", "HDL", "CHOLHDL", "VLDL", "LDLCALC", "LDLDIRECT"  Physical Exam:    VS:  BP (!) 150/100 (BP Location: Right Arm, Patient Position: Sitting, Cuff Size: Normal)   Pulse (!) 36   Ht 5\' 5"  (1.651 m)   Wt 146 lb (66.2 kg)   SpO2 99%   BMI 24.30 kg/m     Wt Readings from Last 3 Encounters:  12/19/22 146 lb (66.2 kg)  10/30/22 153 lb (69.4 kg)  09/24/21 148 lb 9.6 oz (67.4 kg)     GEN:  Well nourished, well developed in no acute distress HEENT: Normal NECK: No JVD; No carotid bruits LYMPHATICS: No lymphadenopathy CARDIAC: 1/6 aortic outflow murmur does not encompass S2 no AR and does not radiate to the carotids consistent with mild aortic stenosis RRR, norubs, gallops RESPIRATORY:  Clear to auscultation without rales, wheezing or rhonchi  ABDOMEN: Soft, non-tender, non-distended MUSCULOSKELETAL:  No edema; No deformity  SKIN: Warm and dry NEUROLOGIC:  Alert and oriented x 3 PSYCHIATRIC:  Normal affect    Signed, Norman Herrlich,  MD  12/19/2022 4:07 PM    [facility]

## 2022-12-19 ENCOUNTER — Ambulatory Visit: Payer: Medicare HMO | Attending: Cardiology | Admitting: Cardiology

## 2022-12-19 ENCOUNTER — Encounter: Payer: Self-pay | Admitting: Cardiology

## 2022-12-19 VITALS — BP 150/100 | HR 36 | Ht 65.0 in | Wt 146.0 lb

## 2022-12-19 DIAGNOSIS — R0602 Shortness of breath: Secondary | ICD-10-CM | POA: Diagnosis not present

## 2022-12-19 DIAGNOSIS — I350 Nonrheumatic aortic (valve) stenosis: Secondary | ICD-10-CM

## 2022-12-19 DIAGNOSIS — R001 Bradycardia, unspecified: Secondary | ICD-10-CM | POA: Diagnosis not present

## 2022-12-19 DIAGNOSIS — N183 Chronic kidney disease, stage 3 unspecified: Secondary | ICD-10-CM

## 2022-12-19 DIAGNOSIS — I1A0 Resistant hypertension: Secondary | ICD-10-CM

## 2022-12-19 NOTE — Patient Instructions (Signed)
Medication Instructions:  Your physician recommends that you continue on your current medications as directed. Please refer to the Current Medication list given to you today.  *If you need a refill on your cardiac medications before your next appointment, please call your pharmacy*   Lab Work: None If you have labs (blood work) drawn today and your tests are completely normal, you will receive your results only by: MyChart Message (if you have MyChart) OR A paper copy in the mail If you have any lab test that is abnormal or we need to change your treatment, we will call you to review the results.   Testing/Procedures: Itamar sleep study   Follow-Up: At [facility], you and your health needs are our priority.  As part of our continuing mission to provide you with exceptional heart care, we have created designated Provider Care Teams.  These Care Teams include your primary Cardiologist (physician) and Advanced Practice Providers (APPs -  Physician Assistants and Nurse Practitioners) who all work together to provide you with the care you need, when you need it.  We recommend signing up for the patient portal called "MyChart".  Sign up information is provided on this After Visit Summary.  MyChart is used to connect with patients for Virtual Visits (Telemedicine).  Patients are able to view lab/test results, encounter notes, upcoming appointments, etc.  Non-urgent messages can be sent to your provider as well.   To learn more about what you can do with MyChart, go to [email or web address].    Your next appointment:   6 month(s)  Provider:   Norman Herrlich, MD    Other Instructions None

## 2022-12-29 ENCOUNTER — Ambulatory Visit: Payer: Medicare HMO | Attending: Cardiology | Admitting: Cardiology

## 2022-12-29 ENCOUNTER — Encounter: Payer: Self-pay | Admitting: Cardiology

## 2022-12-29 VITALS — BP 148/72 | HR 48 | Ht 65.0 in | Wt 148.6 lb

## 2022-12-29 DIAGNOSIS — R001 Bradycardia, unspecified: Secondary | ICD-10-CM | POA: Diagnosis not present

## 2022-12-29 NOTE — Patient Instructions (Signed)
Medication Instructions:  Your physician recommends that you continue on your current medications as directed. Please refer to the Current Medication list given to you today.  *If you need a refill on your cardiac medications before your next appointment, please call your pharmacy*   Lab Work: None ordered   Testing/Procedures: None ordered   Follow-Up: At [facility], you and your health needs are our priority.  As part of our continuing mission to provide you with exceptional heart care, we have created designated Provider Care Teams.  These Care Teams include your primary Cardiologist (physician) and Advanced Practice Providers (APPs -  Physician Assistants and Nurse Practitioners) who all work together to provide you with the care you need, when you need it.  Your next appointment:   as  needed  The format for your next appointment:   In Person  Provider:   Will Camnitz, MD    Thank you for choosing [facility]!!   Sherri Price, RN [phone]        

## 2022-12-29 NOTE — Progress Notes (Signed)
  Electrophysiology Office Note:   Date:  12/29/2022  ID:  Joshua Valenzuela, DOB 03-04-1946, MRN [id number]  Primary Cardiologist: Norman Herrlich, MD Electrophysiologist: None      History of Present Illness:   Joshua Valenzuela is a 77 y.o. male with h/o aortic stenosis, hypertension, CKD stage III, erectile dysfunction, obesity post gastric bypass seen today for  for Electrophysiology evaluation of sinus bradycardia at the request of Norman Herrlich.      He presents to clinic for workup of his sinus bradycardia.  He had a transthoracic echo that showed a low normal ejection fraction with mild aortic regurgitation and stenosis and a mildly dilated ascending aorta.  He presented to his primary physician's office for routine follow-up and was noted to have heart rates in the 30s.  His blood pressure was significantly elevated.  He was asymptomatic.  He checks his blood pressure and is usually well-controlled.  Currently he has no chest pain or shortness of breath.  He is able to exercise.  He rides a stationary bike, and lifts weights up to 5 times per week.  He has no chest pain or shortness of breath when he does these activities.  He does not have any dizziness.  He is able to do all of his daily activities without restriction.      Review of systems complete and found to be negative unless listed in HPI.   EP Information / Studies Reviewed:    EKG is not ordered today. EKG from 10/30/22 reviewed which showed sinus rhythm, rate 35        Risk Assessment/Calculations:           Physical Exam:   VS:  BP (!) 148/72   Pulse (!) 48   Ht 5\' 5"  (1.651 m)   Wt 148 lb 9.6 oz (67.4 kg)   SpO2 97%   BMI 24.73 kg/m    Wt Readings from Last 3 Encounters:  12/29/22 148 lb 9.6 oz (67.4 kg)  12/19/22 146 lb (66.2 kg)  10/30/22 153 lb (69.4 kg)     GEN: Well nourished, well developed in no acute distress NECK: No JVD; No carotid bruits CARDIAC: Regular rate and rhythm with occasional ectopy, no  murmurs, rubs, gallops RESPIRATORY:  Clear to auscultation without rales, wheezing or rhonchi  ABDOMEN: Soft, non-tender, non-distended EXTREMITIES:  No edema; No deformity   ASSESSMENT AND PLAN:    1.  Sinus bradycardia: Patient has significant sinus bradycardia with heart rates at times in the 30s.  He feels well.  He has no chest pain or shortness of breath and is able to do all of his daily activities without any restriction.  In review of the EKG, he has a normal PR interval and narrow QRS complex.  At this point, would hold off on pacemaker implant.  If he does develop exertional symptoms, pacemaker implant would be reasonable.  2.  Hypertension: Elevated today.  Plan per primary physician and primary cardiology  3.  Mild aortic stenosis: Plan per primary cardiology  Follow up with Dr. Elberta Fortis  PRN   Signed, Daralyn Bert Jorja Loa, MD

## 2023-01-16 ENCOUNTER — Telehealth: Payer: Self-pay | Admitting: Cardiology

## 2023-01-16 NOTE — Telephone Encounter (Signed)
Pt c/o Shortness Of Breath: STAT if SOB developed within the last 24 hours or pt is noticeably SOB on the phone  1. Are you currently SOB (can you hear that pt is SOB on the phone)?  No   2. How long have you been experiencing SOB?  Has been ongoing but more consistent over the past 3-4 days  3. Are you SOB when sitting or when up moving around?  Both   4. Are you currently experiencing any other symptoms?  No

## 2023-01-19 NOTE — Telephone Encounter (Signed)
Patient c/o SOB - was mainly upon waking but now seems to be increased.  Does not c/o any SOB with activity.  Has not checked his BP today but states that it typically runs 160-180/70-80's & HR runs 35-45 which he states is normal for him.  No other c/o of chest pain, weight gain or dizziness.  Does also mention that it has been 4 weeks and he has not heard anything about starting the monitor, no one has called him with the pin number.

## 2023-01-19 NOTE — Telephone Encounter (Signed)
I believe the patient is talking about the Itamar sleep test not the cardiac monitor.

## 2023-01-26 ENCOUNTER — Encounter: Payer: Self-pay | Admitting: Cardiology

## 2023-01-26 DIAGNOSIS — G4733 Obstructive sleep apnea (adult) (pediatric): Secondary | ICD-10-CM | POA: Diagnosis not present

## 2023-01-26 NOTE — Telephone Encounter (Signed)
Pt came by the office stating that he called his insurance for precert as he has been waiting for 6 weeks and they advised him he did not need a precert for device. Device has been registered and pin code given.

## 2023-01-28 ENCOUNTER — Ambulatory Visit: Payer: Medicare HMO | Attending: Cardiology

## 2023-01-28 ENCOUNTER — Telehealth: Payer: Self-pay | Admitting: Cardiology

## 2023-01-28 DIAGNOSIS — N183 Chronic kidney disease, stage 3 unspecified: Secondary | ICD-10-CM

## 2023-01-28 DIAGNOSIS — I350 Nonrheumatic aortic (valve) stenosis: Secondary | ICD-10-CM

## 2023-01-28 DIAGNOSIS — I1A0 Resistant hypertension: Secondary | ICD-10-CM

## 2023-01-28 DIAGNOSIS — R001 Bradycardia, unspecified: Secondary | ICD-10-CM

## 2023-01-28 DIAGNOSIS — R0602 Shortness of breath: Secondary | ICD-10-CM

## 2023-01-28 NOTE — Telephone Encounter (Signed)
Patient would like a call back to discuss what happens now that he has completed the monitor requested. Please advise.

## 2023-01-28 NOTE — Procedures (Signed)
SLEEP STUDY REPORT Patient Information Study Date: 01/26/2023 Patient Name: Joshua Valenzuela Patient ID: [id number] Birth Date: 1946/05/19 Age: 77 Gender:  BMI: 25.0 (W=150 lb, H=5' 5'') Referring Physician: Norman Herrlich, MD  TEST DESCRIPTION: Home sleep apnea testing was completed using the WatchPat, a Type 1 device, utilizing  peripheral arterial tonometry (PAT), chest movement, actigraphy, pulse oximetry, pulse rate, body position and snore.  AHI was calculated with apnea and hypopnea using valid sleep time as the denominator. RDI includes apneas,  hypopneas, and RERAs. The data acquired and the scoring of sleep and all associated events were performed in  accordance with the recommended standards and specifications as outlined in the AASM Manual for the Scoring of  Sleep and Associated Events 2.2.0 (2015).   FINDINGS:   1. Mild Obstructive Sleep Apnea with AHI 7.4/hr overall but severe during REM sleep with REM AHI 30.9/hr.   2. No Central Sleep Apnea with pAHIc 0.2/hr.   3. Oxygen desaturations as low as 82%.   4. Mild to moderate snoring was present. O2 sats were < 88% for 0.1 min.   5. Total sleep time was 6 hrs and 39 min.   6. 14.3% of total sleep time was spent in REM sleep.   7. Normal sleep onset latency at 22 min.   8. Normal REM sleep onset latency at 96 min.   9. Total awakenings were 16 .  10. Arrhythmia detection: Suggestive of possible brief atrial fibrillation lasting 17 min and 38 seconds. This is not  diagnostic and further testing with outpatient telemetry monitoring is recommended.  DIAGNOSIS: Mild Obstructive Sleep Apnea (G47.33) overall but Severe OSA during REM sleep. Possible atrial fibrillation  RECOMMENDATIONS: 1. Clinical correlation of these findings is necessary. The decision to treat obstructive sleep apnea (OSA) is usually  based on the presence of apnea symptoms or the presence of associated medical conditions such as Hypertension,  Congestive  Heart Failure, Atrial Fibrillation or Obesity. The most common symptoms of OSA are snoring, gasping for  breath while sleeping, daytime sleepiness and fatigue.   2. Initiating apnea therapy is recommended given the presence of symptoms and/or associated conditions.  Recommend proceeding with one of the following:   a. Auto-CPAP therapy with a pressure range of 5-20cm H2O.   b. An oral appliance (OA) that can be obtained from certain dentists with expertise in sleep medicine. These are  primarily of use in non-obese patients with mild and moderate disease.   c. An ENT consultation which may be useful to look for specific causes of obstruction and possible treatment  options.   d. If patient is intolerant to PAP therapy, consider referral to ENT for evaluation for hypoglossal nerve stimulator.   3. Close follow-up is necessary to ensure success with CPAP or oral appliance therapy for maximum benefit .  4. A follow-up oximetry study on CPAP is recommended to assess the adequacy of therapy and determine the need  for supplemental oxygen or the potential need for Bi-level therapy. An arterial blood gas to determine the adequacy of  baseline ventilation and oxygenation should also be considered.  5. Healthy sleep recommendations include: adequate nightly sleep (normal 7-9 hrs/night), avoidance of caffeine after  noon and alcohol near bedtime, and maintaining a sleep environment that is cool, dark and quiet.  6. Weight loss for overweight patients is recommended. Even modest amounts of weight loss can significantly  improve the severity of sleep apnea.  7. Snoring recommendations include: weight loss where appropriate,  side sleeping, and avoidance of alcohol before  bed.  8. Operation of motor vehicle should be avoided when sleepy.  Signature: Armanda Magic, MD; [facility]; Diplomat, American Board of Sleep  Medicine Electronically Signed: 01/28/2023 11:22:23 AM

## 2023-01-29 NOTE — Telephone Encounter (Signed)
Called patient and informed him that the sleep study had not been interpreted yet and that he should call back in a couple of days after the doctor has interpreted the test for the results. Patient was agreeable with this plan and had no further questions

## 2023-02-05 ENCOUNTER — Telehealth: Payer: Self-pay | Admitting: Cardiology

## 2023-02-05 NOTE — Telephone Encounter (Signed)
Pt calling in for sleep study results

## 2023-02-12 ENCOUNTER — Telehealth: Payer: Self-pay | Admitting: *Deleted

## 2023-02-12 DIAGNOSIS — I10 Essential (primary) hypertension: Secondary | ICD-10-CM

## 2023-02-12 DIAGNOSIS — G4733 Obstructive sleep apnea (adult) (pediatric): Secondary | ICD-10-CM

## 2023-02-12 NOTE — Telephone Encounter (Signed)
-----   Message from Armanda Magic sent at 01/28/2023 11:23 AM EDT ----- Please let patient know that they have sleep apnea.  Recommend therapeutic CPAP titration for treatment of patient's sleep disordered breathing.  If unable to perform an in lab titration then initiate ResMed auto CPAP from 4 to 15cm H2O with heated humidity and mask of choice and overnight pulse ox on CPAP.

## 2023-02-12 NOTE — Telephone Encounter (Signed)
The patient has been notified of the result and verbalized understanding.  All questions (if any) were answered. Joshua Valenzuela, CMA 02/12/2023 5:45 PM    If unable to perform an in lab titration then initiate ResMed auto CPAP from 4 to 15cm H2O with heated humidity and mask of choice and overnight pulse ox on CPAP.   Upon patient request DME selection is  [facility]. Patient understands he will be contacted by [facility] to set up his cpap. Patient understands to call if [facility] does not contact him with new setup in a timely manner. Patient understands they will be called once confirmation has been received from [facility] that they have received their new machine to schedule 10 week follow up appointment.   [facility] notified of new cpap order  Please add to airview Patient was grateful for the call and thanked me.

## 2023-02-12 NOTE — Telephone Encounter (Signed)
Patient calling back because he hasn't heard anything. He is upset. Please advise

## 2023-02-16 NOTE — Telephone Encounter (Signed)
Patient is calling again in regards to CPAP. He states that [facility] has not yet received the prescriptions for this and would like to know if this has been sent yet. Please advise.

## 2023-02-18 NOTE — Telephone Encounter (Addendum)
DME changed to [facility] due to insurance.  Upon patient request DME selection is [facility]. Patient understands he will be contacted by [facility] to set up his cpap. Patient understands to call if [facility] does not contact him with new setup in a timely manner. Patient understands they will be called once confirmation has been received from [facility] that they have received their new machine to schedule 10 week follow up appointment.   [facility] notified of new cpap order  Please add to airview Patient was grateful for the call and thanked me.

## 2023-02-18 NOTE — Telephone Encounter (Signed)
The patient has been notified of the result and verbalized understanding.  All questions (if any) were answered.

## 2023-03-09 ENCOUNTER — Telehealth: Payer: Self-pay | Admitting: Cardiology

## 2023-03-09 NOTE — Telephone Encounter (Signed)
Patient called to follow-up on the status of his CPAP machine and stated he has been trying to get this resolved for the last two months.

## 2023-03-12 NOTE — Telephone Encounter (Signed)
Patient reiterates that he has been following up on the status of CPAP machine for 2 months and he has not heard back from anyone. She would like to inform Dr. Dulce Sellar and a call back to discuss.

## 2023-03-16 NOTE — Telephone Encounter (Signed)
Send message to Harlin Heys, CMA for information on pts CPAP machine. Test was completed, read and pt was notified on 02-12-23 that the CPAP vendor would notify him when it had been authorized by insurance.

## 2023-03-16 NOTE — Telephone Encounter (Signed)
Reached out to patient and informed him the number in his chart was not a working number for him which is why he had not received a call about his cpap machine. I was able to update his phone number and his dme will contact him this week.

## 2023-04-17 DIAGNOSIS — R0902 Hypoxemia: Secondary | ICD-10-CM | POA: Diagnosis not present

## 2023-04-17 DIAGNOSIS — G4730 Sleep apnea, unspecified: Secondary | ICD-10-CM | POA: Diagnosis not present

## 2023-04-28 DIAGNOSIS — R001 Bradycardia, unspecified: Secondary | ICD-10-CM | POA: Diagnosis not present

## 2023-04-28 DIAGNOSIS — I2721 Secondary pulmonary arterial hypertension: Secondary | ICD-10-CM | POA: Diagnosis not present

## 2023-04-28 DIAGNOSIS — N183 Chronic kidney disease, stage 3 unspecified: Secondary | ICD-10-CM | POA: Diagnosis not present

## 2023-04-28 DIAGNOSIS — E785 Hyperlipidemia, unspecified: Secondary | ICD-10-CM | POA: Diagnosis not present

## 2023-04-28 DIAGNOSIS — Z6824 Body mass index (BMI) 24.0-24.9, adult: Secondary | ICD-10-CM | POA: Diagnosis not present

## 2023-04-28 DIAGNOSIS — N400 Enlarged prostate without lower urinary tract symptoms: Secondary | ICD-10-CM | POA: Diagnosis not present

## 2023-06-05 ENCOUNTER — Telehealth: Payer: Self-pay

## 2023-06-05 NOTE — Telephone Encounter (Signed)
 No answer

## 2023-06-08 ENCOUNTER — Ambulatory Visit: Payer: Medicare HMO | Attending: Cardiology | Admitting: Cardiology

## 2023-06-08 VITALS — BP 160/82 | HR 45 | Ht 66.0 in | Wt 150.0 lb

## 2023-06-08 DIAGNOSIS — I10 Essential (primary) hypertension: Secondary | ICD-10-CM | POA: Diagnosis not present

## 2023-06-08 DIAGNOSIS — G4733 Obstructive sleep apnea (adult) (pediatric): Secondary | ICD-10-CM

## 2023-06-08 NOTE — Progress Notes (Addendum)
 SlEEP MEDICINE VIRTUAL CONSULT NOTE via Video Note   Because of Joshua Valenzuela's co-morbid illnesses, he is at least at moderate risk for complications without adequate follow up.  This format is felt to be most appropriate for this patient at this time.  All issues noted in this document were discussed and addressed.  A limited physical exam was performed with this format.  Please refer to the patient's chart for his consent to telehealth for [facility].  ID:  Joshua Valenzuela, DOB March 12, 1946, MRN [id number] The patient was identified using 2 identifiers.  Patient Location: Home Provider Location: Office/Clinic  Date:  06/24/2023   ID:  Joshua Valenzuela, DOB [birth year], MRN [id number]  PCP:  Elaine Garnette BIRCH., MD  Cardiologist: Redell Leiter, MD    History of Present Illness:  Joshua Valenzuela is a 78 y.o. male who is being seen today for the evaluation of OSA at the request of Redell Leiter, MD.  This is a 78 year old male with a history of of bradycardia, aortic atherosclerosis, hypertension and excessive daytime sleepiness who referred by Dr. Leiter for evaluation obstructive sleep apnea.  The patient recently underwent a home sleep study showing mild obstructive sleep apnea with an AHI of 7.4/h but severe during REM sleep with a REM AHI of 30.9/h.  He was started on auto CPAP from 4 to 15 cm H2O.  He is now referred for sleep medicine consultation to establish sleep care and treatment of obstructive sleep apnea.  He is doing well with his PAP device and thinks that he has gotten used to it.  He tolerates the nasal mask but says that it gets too hot around the nose and has problems keeping it on at night. He feels the pressure is adequate.  Since going on PAP he feels rested in the am and has no significant daytime sleepiness.  He denies any significant mouth or nasal dryness or nasal congestion.  He does not think that he snores.    Past Medical History:  Diagnosis Date    Actinic keratosis 10/30/2022   Aortic atherosclerosis (HCC)    Noted on CT of chest   Benign prostatic hyperplasia with lower urinary tract symptoms 10/30/2022   Bradycardia, unspecified 09/20/2021   CKD (chronic kidney disease) stage 3, GFR 30-59 ml/min (HCC) 05/08/2021   Closed fracture of left tibial plateau 11/06/2016   Formatting of this note might be different from the original. 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   HTN (hypertension)    Hydrocele, unspecified 10/30/2022   Hypothyroidism, unspecified 10/30/2022   Insomnia 10/30/2022   Neoplasm of uncertain behavior of skin 09/20/2021   Neuromuscular dysfunction of bladder, unspecified 06/16/2023   Noncompliance with medication regimen 09/20/2021   Obesity    s/p gastric bypass   Obesity 06/18/2021   s/p gastric bypass   Presence of intraocular lens 09/20/2021   Pulmonary hypertension, unspecified (HCC) 09/20/2021   Retention of urine, unspecified 10/30/2022   Sinus bradycardia    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 10/30/2022   Unilateral inguinal hernia, without  obstruction or gangrene, not specified as recurrent 09/20/2021   Unspecified cataract 09/20/2021   Unspecified hemorrhoids 10/30/2022    Past Surgical History:  Procedure Laterality Date   FRACTURE SURGERY     1966 and 2018/ Pin inserted   GASTRIC BYPASS  06/02/2002   HERNIA REPAIR  60's   PENILE PROSTHESIS PLACEMENT  01/27/2018   Procedure: Insertion of a 3 piece penile implant;  Surgeon: Vonzell Beverley Manis, MD;Location: [facility] OR   TONSILLECTOMY      Current Medications: Current Meds  Medication Sig   b complex vitamins capsule Take 2 capsules by mouth every morning.   chlorthalidone (HYGROTON) 25 MG tablet Take 25 mg by mouth daily.   Cholecalciferol 50 MCG (2000 UT) TABS Take 1 tablet by mouth daily.   diclofenac Sodium (VOLTAREN) 1 % GEL Apply 2 g topically daily as needed (joint pain).   ferrous sulfate 325 (65 FE) MG tablet Take 325 mg by mouth every morning.   furosemide  (LASIX ) 20 MG tablet Take 1 tablet (20 mg total) by mouth daily.   levothyroxine (SYNTHROID) 50 MCG tablet Take 50 mcg by mouth daily.   lisinopril (ZESTRIL) 20 MG tablet Take 20 mg by mouth daily.   Multiple Vitamin (MULTIVITAMIN) tablet Take 1 tablet by mouth daily.   rOPINIRole (REQUIP) 0.5 MG tablet Take 0.5 mg by mouth at bedtime.   Selenium 200 MCG CAPS Take 200 mcg by mouth 2 (two) times daily.   tamsulosin (FLOMAX) 0.4 MG CAPS capsule Take 0.4 mg by mouth 2 (two) times daily.   terazosin (HYTRIN) 5 MG capsule Take 5 mg by mouth at bedtime.   vitamin E 1000 UNIT capsule Take 1,000 Units by mouth daily.   [DISCONTINUED] hydrALAZINE  (APRESOLINE ) 25 MG tablet Take 25 mg by mouth at bedtime.    Allergies:   Amlodipine   Social History   Socioeconomic History   Marital status: Married    Spouse name: Not on file   Number of children: Not on file   Years of education: Not on file   Highest education level: Not on file  Occupational History   Occupation: retired  Tobacco Use   Smoking status: Former    Current packs/day: 0.00    Types: Cigarettes    Quit date: 06/01/1976    Years since quitting: 47.0    Passive exposure: Past   Smokeless tobacco: Never  Vaping Use   Vaping status: Never Used  Substance and Sexual Activity   Alcohol use: No   Drug use: No   Sexual activity: Not on file  Other Topics Concern   Not on file  Social History Narrative   Not on file    Social Drivers of Health   Financial Resource Strain: Not on file  Food Insecurity: Not on file  Transportation Needs: Not on file  Physical Activity: Not on file  Stress: Not on file  Social Connections: Not on file     Family History:  The patient's family history is not on file.   ROS:   Please see the history of present illness.    ROS All other systems reviewed and are negative.      No data to display             PHYSICAL EXAM:   VS:  BP (!) 160/82   Pulse (!) 45   Ht 5' 6 (1.676 m)   Wt 150 lb (68 kg)   BMI 24.21 kg/m    GEN: Well  nourished, well developed, in no acute distress  HEENT: normal  Neck: no JVD, carotid bruits, or masses Cardiac: RRR; no murmurs, rubs, or gallops,no edema.  Intact distal pulses bilaterally.  Respiratory:  clear to auscultation bilaterally, normal work of breathing GI: soft, nontender, nondistended, + BS MS: no deformity or atrophy  Skin: warm and dry, no rash Neuro:  Alert and Oriented x 3, Strength and sensation are intact Psych: euthymic mood, full affect  Wt Readings from Last 3 Encounters:  06/18/23 151 lb 9.6 oz (68.8 kg)  06/08/23 150 lb (68 kg)  12/29/22 148 lb 9.6 oz (67.4 kg)      Studies/Labs Reviewed:   HST, PAP compliance download  Recent Labs: No results found for requested labs within last 365 days.    ASSESSMENT:    1. OSA (obstructive sleep apnea)   2. Essential hypertension      PLAN:  In order of problems listed above:  OSA - The patient is tolerating PAP therapy well without any problems. The PAP download performed by his DME was personally reviewed and interpreted by me today and showed an AHI of 3 /hr on auto CPAP from 4-15 cm H2O with 20% compliance in using more than 4 hours nightly.  The patient has been using and benefiting from PAP use and will continue to benefit from therapy.  -he has not been using his device a lot a night because the mask gags him>>he is using the nasal  pillow mask.  He tried the nasal mask but it made his face too hot.   -I will order him a nasal cushion mask to see if it is more comfortable -I encouraged him to try to use his device at least 4 to 5 hours a night  Hypertension -BP controlled on exam today -Continue prescription drug management chlorthalidone 25 mg daily, hydralazine  25 mg nightly, Zestril 20 mg daily as needed refills  Followup with me in 8 weeks   Time Spent: 15 minutes total time of encounter, including 10 minutes spent in face-to-face patient care on the date of this encounter. This time includes coordination of care and counseling regarding above mentioned problem list. Remainder of non-face-to-face time involved reviewing chart documents/testing relevant to the patient encounter and documentation in the medical record. I have independently reviewed documentation from referring provider  Medication Adjustments/Labs and Tests Ordered: Current medicines are reviewed at length with the patient today.  Concerns regarding medicines are outlined above.  Medication changes, Labs and Tests ordered today are listed in the Patient Instructions below.  There are no Patient Instructions on file for this visit.   Signed, Wilbert Bihari, MD  06/24/2023 2:14 PM    [facility] [address] [place], [place], [place]  [address] Phone: [phone]; Fax: [phone]

## 2023-06-09 ENCOUNTER — Telehealth: Payer: Self-pay | Admitting: *Deleted

## 2023-06-09 DIAGNOSIS — G4733 Obstructive sleep apnea (adult) (pediatric): Secondary | ICD-10-CM

## 2023-06-09 DIAGNOSIS — I10 Essential (primary) hypertension: Secondary | ICD-10-CM

## 2023-06-09 DIAGNOSIS — I1A0 Resistant hypertension: Secondary | ICD-10-CM

## 2023-06-09 NOTE — Telephone Encounter (Signed)
 Quintella Reichert, MD  Reesa Chew, CMA Order a nasal cushion mask

## 2023-06-09 NOTE — Telephone Encounter (Signed)
-----   Message from Wilbert Bihari sent at 06/08/2023 10:37 AM EST ----- Please let patient know that he is not having any significant apneas or events during sleep when he is using CPAP but he greatly needs to improve his compliance.  I did send you a note to order a nasal cushion mask

## 2023-06-11 DIAGNOSIS — I2721 Secondary pulmonary arterial hypertension: Secondary | ICD-10-CM | POA: Diagnosis not present

## 2023-06-11 NOTE — Telephone Encounter (Signed)
 The patient has been notified of the result and verbalized understanding.  All questions (if any) were answered. Latrelle Dodrill, CMA 06/11/2023 4:53 PM    Patient will notify us when he has received his mask.

## 2023-06-16 ENCOUNTER — Encounter: Payer: Self-pay | Admitting: Cardiology

## 2023-06-16 DIAGNOSIS — I700 Atherosclerosis of aorta: Secondary | ICD-10-CM | POA: Insufficient documentation

## 2023-06-16 DIAGNOSIS — N319 Neuromuscular dysfunction of bladder, unspecified: Secondary | ICD-10-CM

## 2023-06-16 HISTORY — DX: Neuromuscular dysfunction of bladder, unspecified: N31.9

## 2023-06-17 NOTE — Progress Notes (Signed)
Cardiology Office Note:    Date:  06/18/2023   ID:  Joshua Valenzuela, DOB 10-29-1945, MRN [id number]  PCP:  Joshua Floor., MD  Cardiologist:  Joshua Herrlich, MD    Referring MD: Joshua Floor., MD    ASSESSMENT:    1. Resistant hypertension   2. Stage 3 chronic kidney disease, unspecified whether stage 3a or 3b CKD (HCC)   3. Nonrheumatic aortic valve stenosis    PLAN:    In order of problems listed above:  Improved but still remains quite difficult to achieve target I think he would benefit he took hydralazine he will take it twice a day with second dose late afternoon to avoid orthostatic hypotension with a strong alpha-blocker Hytrin Continue his current diuretic combination of both hydrochlorothiazide and furosemide he thinks it has helped with his dependent edema as well as his other antihypertensive lisinopril Blood pressure in a few weeks and if he remains above target for him in the range of 1 50-1 60 systolic then I think centrally active clonidine at bedtime may be a good choice Kidney function is stable Lipids are at target he is not on a statin Aortic stenosis remains mild likely needs a repeat echocardiogram in 1 to 2 years   Next appointment: 6 months   Medication Adjustments/Labs and Tests Ordered: Current medicines are reviewed at length with the patient today.  Concerns regarding medicines are outlined above.  No orders of the defined types were placed in this encounter.  No orders of the defined types were placed in this encounter.    History of Present Illness:    Joshua Valenzuela is a 78 y.o. male with a hx of resistant hypertension with stage III CKD sinus bradycardia mild aortic stenosis and obstructive sleep apnea last seen 12/19/2022.  His echo cardiogram performed July showed aortic stenosis and was defined as mild to moderate but the mean gradient was 18 mmHg he had biatrial enlargement normal left ventricular size function EF 50 to  55%  Compliance with diet, lifestyle and medications: Yes  He continues to feel well vigorous active and is having no cardiovascular symptoms of chest pain shortness of breath orthopnea palpitation or syncope He has a mild degree of chronic peripheral edema likely related to his alpha-blocker he is taking Hytrin that was started at the [place] decades ago for hypertension but likely has high helping him with his urinary symptoms His home blood pressure valid device good technique runs in the range of 160-170/80 on his own he had stopped hydralazine Recent labs with his primary care physician 04/28/2023 creatinine 1.69 he was told it was improved potassium 4.1 hemoglobin 9.6 LDL 60 cholesterol 142 He had severe edema with calcium channel blocker combined with his alpha-blocker in the past Past Medical History:  Diagnosis Date   Actinic keratosis 10/30/2022   Aortic atherosclerosis (HCC)    Noted on CT of chest   Benign prostatic hyperplasia with lower urinary tract symptoms 10/30/2022   Bradycardia, unspecified 09/20/2021   CKD (chronic kidney disease) stage 3, GFR 30-59 ml/min (HCC) 05/08/2021   Closed fracture of left tibial plateau 11/06/2016   Formatting of this note might be different from the original. 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   HTN (hypertension)    Hydrocele, unspecified 10/30/2022   Hypothyroidism, unspecified 10/30/2022   Insomnia 10/30/2022   Neoplasm of uncertain behavior of skin 09/20/2021   Neuromuscular  dysfunction of bladder, unspecified 06/16/2023   Noncompliance with medication regimen 09/20/2021   Obesity    s/p gastric bypass   Obesity 06/18/2021   s/p gastric bypass   Presence of intraocular lens 09/20/2021   Pulmonary hypertension, unspecified (HCC) 09/20/2021   Retention of urine, unspecified 10/30/2022   Sinus bradycardia    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   chlorthalidone (HYGROTON) 25 MG tablet Take 25 mg by mouth daily.   Cholecalciferol 50 MCG (2000 UT) TABS Take 1 tablet by mouth daily.   clonazePAM (KLONOPIN) 0.5 MG tablet Take 0.5 mg by mouth at bedtime.   diclofenac Sodium (VOLTAREN) 1 % GEL Apply 2 g topically daily as needed (joint pain).   ferrous sulfate 325 (65 FE) MG tablet Take 325 mg by mouth every morning.   furosemide (LASIX) 20 MG tablet Take 1 tablet (20 mg total) by mouth daily.   hydrALAZINE (APRESOLINE) 25 MG tablet Take 25 mg by mouth at bedtime.   levothyroxine (SYNTHROID) 50 MCG tablet Take 50 mcg by mouth daily.   lisinopril (ZESTRIL) 20 MG tablet Take 20 mg by mouth daily.   Multiple Vitamin (MULTIVITAMIN) tablet Take 1 tablet by mouth daily.   rOPINIRole (REQUIP) 0.5 MG tablet Take 0.5 mg by mouth at bedtime.   Selenium 200 MCG CAPS Take 200 mcg by mouth 2 (two) times daily.   tamsulosin (FLOMAX) 0.4 MG CAPS capsule Take 0.4 mg by mouth 2 (two) times daily.   terazosin (HYTRIN) 5 MG capsule Take 5 mg by mouth at bedtime.   vitamin E 1000 UNIT capsule Take 1,000 Units by mouth daily.      EKGs/Labs/Other Studies Reviewed:    The following studies were reviewed today:  Cardiac Studies & Procedures       ECHOCARDIOGRAM  ECHOCARDIOGRAM COMPLETE 12/02/2022  Narrative ECHOCARDIOGRAM REPORT    Patient Name:   Joshua Valenzuela Date of Exam: 12/02/2022 Medical Rec #:  [id number]     Height:       65.0 in Accession #:    [id number]    Weight:       153.0 lb Date of Birth:  [birth year]     BSA:          1.765 m Patient Age:    76 years      BP:           178/88 mmHg Patient Gender: M             HR:           33 bpm. Exam Location:  [facility]  Procedure: 2D Echo, Cardiac Doppler, Color Doppler and Strain Analysis  Indications:    Nonrheumatic aortic valve stenosis [I35.0 (ICD-10-CM)  History:        Patient has prior history of Echocardiogram examinations, most recent 10/17/2021. Aortic Valve Disease, Arrythmias:Bradycardia; Risk Factors:Former Smoker and Hypertension.  Sonographer:    Margreta Journey RDCS Referring Phys: [phone] JENNIFER C WOODY  IMPRESSIONS   1. Left ventricular ejection fraction, by estimation, is 50 to 55%. The left ventricle has low normal function. The left ventricle has no regional wall motion abnormalities. The left ventricular internal cavity size was mildly dilated. Left ventricular diastolic parameters are indeterminate. The average left ventricular global longitudinal strain is -16.7 %. The global longitudinal strain is abnormal. 2. Right ventricular systolic function is normal. The right ventricular size is normal. 3. Left atrial size was severely dilated. 4. Right atrial size was moderately dilated. 5. The mitral valve is normal in structure. Mild to moderate mitral valve regurgitation. No evidence of mitral stenosis. 6. The aortic valve is calcified. There is mild calcification of the aortic valve. There is mild thickening of the aortic valve. Aortic valve regurgitation is trivial. Mild to moderate aortic valve stenosis. Aortic valve area, by VTI measures 1.16 cm. Aortic valve mean gradient measures 18.4 mmHg. 7. The inferior vena cava is normal in size with  greater than 50% respiratory variability, suggesting right atrial pressure of 3 mmHg.  FINDINGS Left Ventricle: Left ventricular ejection fraction, by estimation, is 50 to 55%. The left ventricle has low normal function. The left ventricle has no regional wall motion abnormalities. The average left ventricular global longitudinal strain is -16.7 %. The global longitudinal strain is abnormal. The left ventricular internal cavity size was mildly dilated. There is no left ventricular hypertrophy. Left ventricular diastolic parameters are indeterminate.  Right Ventricle: The right ventricular size is normal. No increase in right ventricular wall thickness. Right ventricular systolic function is normal.  Left Atrium: Left atrial size was severely dilated.  Right Atrium: Right atrial size was moderately dilated.  Pericardium: There is no evidence of pericardial effusion.  Mitral Valve: The mitral valve is normal in structure. Mild to moderate mitral valve regurgitation. No evidence of mitral valve stenosis.  Tricuspid Valve: The tricuspid valve is normal in structure. Tricuspid valve regurgitation is not demonstrated. No evidence of tricuspid stenosis.  Aortic Valve: The aortic valve is calcified. There is mild calcification of the aortic valve. There is mild thickening of the aortic valve. Aortic valve regurgitation is trivial. Aortic regurgitation PHT measures 977 msec. Mild to moderate aortic stenosis is present. Aortic valve mean gradient measures 18.4 mmHg. Aortic valve peak gradient measures 33.1 mmHg. Aortic valve area, by VTI measures 1.16 cm.  Pulmonic Valve: The pulmonic valve was normal in structure. Pulmonic valve regurgitation is trivial. No evidence of pulmonic stenosis.  Aorta: The aortic root is normal in size and structure.  Venous: The inferior vena cava is normal in size with greater than 50% respiratory variability, suggesting right atrial pressure of 3 mmHg.  IAS/Shunts: No  atrial level shunt detected by color flow Doppler.   LEFT VENTRICLE PLAX 2D LVIDd:         6.30 cm   Diastology LVIDs:         4.60 cm   LV e' medial:    4.57 cm/s LV PW:         0.90 cm   LV E/e' medial:  18.4 LV IVS:        1.10 cm   LV e' lateral:   3.48 cm/s LVOT diam:     2.20 cm   LV E/e' lateral: 24.1 LV SV:         94 LV SV Index:   53        2D  Longitudinal Strain LVOT Area:     3.80 cm  2D Strain GLS Avg:     -16.7 %   RIGHT VENTRICLE             IVC RV Basal diam:  5.00 cm     IVC diam: 2.70 cm RV Mid diam:    3.90 cm RV S prime:     13.10 cm/s TAPSE (M-mode): 3.3 cm  LEFT ATRIUM              Index        RIGHT ATRIUM           Index LA diam:        5.50 cm  3.12 cm/m   RA Area:     22.90 cm LA Vol (A2C):   155.0 ml 87.81 ml/m  RA Volume:   85.10 ml  48.21 ml/m LA Vol (A4C):   99.5 ml  56.37 ml/m LA Biplane Vol: 125.0 ml 70.81 ml/m AORTIC VALVE                     PULMONIC VALVE AV Area (Vmax):    1.14 cm      PR End Diast Vel: 2.06 msec AV Area (Vmean):   1.06 cm AV Area (VTI):     1.16 cm AV Vmax:           287.60 cm/s AV Vmean:          199.800 cm/s AV VTI:            0.808 m AV Peak Grad:      33.1 mmHg AV Mean Grad:      18.4 mmHg LVOT Vmax:         86.25 cm/s LVOT Vmean:        55.700 cm/s LVOT VTI:          0.246 m LVOT/AV VTI ratio: 0.31 AI PHT:            977 msec  AORTA Ao Root diam: 3.50 cm Ao Asc diam:  3.40 cm Ao Desc diam: 2.60 cm  MITRAL VALVE               TRICUSPID VALVE MV Area (PHT): 3.89 cm    TR Peak grad:   21.0 mmHg MV Decel Time: 195 msec    TR Vmax:        229.00 cm/s MR Peak grad: 100.4 mmHg MR Mean grad: 73.0 mmHg    SHUNTS MR Vmax:      501.00 cm/s  Systemic VTI:  0.25 m MR Vmean:     407.0 cm/s   Systemic Diam: 2.20 cm MV E velocity: 83.90 cm/s MV A velocity: 79.80 cm/s MV E/A ratio:  1.05  Gypsy Balsam MD Electronically signed by Gypsy Balsam MD Signature Date/Time: 12/02/2022/2:33:11  PM    Final   MONITORS  LONG TERM MONITOR (3-14 DAYS) 11/19/2022  Narrative Patch Wear Time:  13 days and 20 hours (2024-05-30T10:35:00-0400 to 2024-06-13T07:17:37-0400)  Patient had a min HR of 27 bpm, max HR of 133 bpm, and avg HR of 43 bpm. Predominant underlying rhythm was Sinus Rhythm. Bundle Branch Block/IVCD was present.  There were no triggered or diary events.  Marked sinus bradycardia seen nocturnally.  There are no episodes of second or third-degree AV nodal block.  There were no pauses of 3 seconds or greater.  The brief episode of idioventricular rhythm was associated with marked nocturnal sinus bradycardia.  15 Supraventricular Tachycardia runs occurred, the run with the fastest interval lasting 6 beats with a max rate of 133 bpm, the longest lasting 13 beats with an avg rate of 92 bpm.  Idioventricular Rhythm was present.  Isolated SVEs were occasional (3.1%, 25511), SVE Couplets were rare (<1.0%, 172), and SVE Triplets were rare (<1.0%, 42).  Isolated VEs were rare (<1.0%), VE Couplets were rare (<1.0%), and no VE Triplets were present. Ventricular Bigeminy and Trigeminy were present. MD notification criteria for Bradycardia met - report posted prior to notification per account request (MS).               Recent Labs: 06/24/2022: BUN 46; Creatinine, Ser 1.75; NT-Pro BNP 4,466; Potassium 3.8; Sodium 140  Recent Lipid Panel No results found for: "CHOL", "TRIG", "HDL", "CHOLHDL", "VLDL", "LDLCALC", "LDLDIRECT"  Physical Exam:    VS:  BP (!) 160/78   Pulse (!) 37   Ht 5\' 6"  (1.676 m)   Wt 151 lb 9.6 oz (68.8 kg)   SpO2 98%   BMI 24.47 kg/m     Wt Readings from Last 3 Encounters:  06/18/23 151 lb 9.6 oz (68.8 kg)  06/08/23 150 lb (68 kg)  12/29/22 148 lb 9.6 oz (67.4 kg)     GEN: 1/6 systolic murmur aortic stenosis well nourished, well developed in no acute distress HEENT: Normal NECK: No JVD; No carotid bruits LYMPHATICS: No lymphadenopathy CARDIAC:  1/6 systolic murmur aortic stenosis no AR S2 is normal RRR,  RESPIRATORY:  Clear to auscultation without rales, wheezing or rhonchi  ABDOMEN: Soft, non-tender, non-distended MUSCULOSKELETAL:   1+ bilateral lower extremity dependent edema; No deformity  SKIN: Warm and dry NEUROLOGIC:  Alert and oriented x 3 PSYCHIATRIC:  Normal affect    Signed, Joshua Herrlich, MD  06/18/2023 8:19 AM    [facility]

## 2023-06-18 ENCOUNTER — Encounter: Payer: Self-pay | Admitting: Cardiology

## 2023-06-18 ENCOUNTER — Ambulatory Visit: Payer: Medicare HMO | Attending: Cardiology | Admitting: Cardiology

## 2023-06-18 VITALS — BP 160/78 | HR 37 | Ht 66.0 in | Wt 151.6 lb

## 2023-06-18 DIAGNOSIS — I1A0 Resistant hypertension: Secondary | ICD-10-CM

## 2023-06-18 DIAGNOSIS — I350 Nonrheumatic aortic (valve) stenosis: Secondary | ICD-10-CM | POA: Diagnosis not present

## 2023-06-18 DIAGNOSIS — I700 Atherosclerosis of aorta: Secondary | ICD-10-CM

## 2023-06-18 DIAGNOSIS — N183 Chronic kidney disease, stage 3 unspecified: Secondary | ICD-10-CM | POA: Diagnosis not present

## 2023-06-18 MED ORDER — HYDRALAZINE HCL 25 MG PO TABS: 25.0000 mg | ORAL_TABLET | Freq: Two times a day (BID) | ORAL | 3 refills | Status: AC

## 2023-06-18 NOTE — Patient Instructions (Signed)
Medication Instructions:  Your physician has recommended you make the following change in your medication:   START: Hydralazine 25 mg two times daily (Take this medication in the morning and late afternoon)  *If you need a refill on your cardiac medications before your next appointment, please call your pharmacy*   Lab Work: None If you have labs (blood work) drawn today and your tests are completely normal, you will receive your results only by: MyChart Message (if you have MyChart) OR A paper copy in the mail If you have any lab test that is abnormal or we need to change your treatment, we will call you to review the results.   Testing/Procedures: None   Follow-Up: At [facility], you and your health needs are our priority.  As part of our continuing mission to provide you with exceptional heart care, we have created designated Provider Care Teams.  These Care Teams include your primary Cardiologist (physician) and Advanced Practice Providers (APPs -  Physician Assistants and Nurse Practitioners) who all work together to provide you with the care you need, when you need it.  We recommend signing up for the patient portal called "MyChart".  Sign up information is provided on this After Visit Summary.  MyChart is used to connect with patients for Virtual Visits (Telemedicine).  Patients are able to view lab/test results, encounter notes, upcoming appointments, etc.  Non-urgent messages can be sent to your provider as well.   To learn more about what you can do with MyChart, go to [email or web address].    Your next appointment:   6 month(s)  Provider:   Norman Herrlich, MD    Other Instructions Check blood pressure daily and after 3 weeks send a list of blood pressures to the office.          1. Avoid all over-the-counter antihistamines except Claritin/Loratadine and Zyrtec/Cetrizine. 2. Avoid all combination including cold sinus allergies flu decongestant and sleep  medications 3. You can use Robitussin DM Mucinex and Mucinex DM for cough. 4. can use Tylenol aspirin ibuprofen and naproxen but no combinations such as sleep or sinus.

## 2023-06-19 ENCOUNTER — Telehealth: Payer: Self-pay | Admitting: *Deleted

## 2023-06-19 NOTE — Telephone Encounter (Signed)
Patient called in to say he feels like he suffocating he needs more air. Will ask Dr Mayford Knife to advise.

## 2023-06-23 NOTE — Telephone Encounter (Signed)
He is using the NASAL MASK but he says he just can not tolerate it and he is going to turn it back in. He says he has tried several mask and the nasal mask worked the bets but still feels like he is suffocating.

## 2023-07-23 NOTE — Telephone Encounter (Signed)
Reached out to the patient per DPR and lmtcb to discuss his decision to per Dr Mayford Knife: He is not a candiate for the [facility] device as AHI is < 15/hr.  The only option is dental referral for oral device but they are $3000 out of pocket and insurance will not cover.  Best option is trying the CPAP at a lower set pressure.

## 2023-07-31 NOTE — Telephone Encounter (Addendum)
 Patient states he can not wear that cpap machine and will speak to Dr Mayford Knife at his next appointment.

## 2023-08-03 NOTE — Telephone Encounter (Signed)
 Patient did get his new mask but he returned his cpap machine 3 weeks ago. He said he gave up on it.

## 2023-08-03 NOTE — Progress Notes (Signed)
 This encounter was created in error - please disregard.

## 2023-08-04 ENCOUNTER — Ambulatory Visit: Payer: Medicare HMO | Admitting: Cardiology

## 2023-08-04 ENCOUNTER — Telehealth: Payer: Self-pay

## 2023-08-04 VITALS — BP 130/65 | Ht 66.0 in | Wt 150.0 lb

## 2023-08-04 DIAGNOSIS — I10 Essential (primary) hypertension: Secondary | ICD-10-CM

## 2023-08-04 DIAGNOSIS — G4733 Obstructive sleep apnea (adult) (pediatric): Secondary | ICD-10-CM

## 2023-08-04 NOTE — Telephone Encounter (Signed)
 Pt had a My Chart call with Dr Mayford Knife today for his CPAP but he has not been able to get on his My Chart... I offered to help him with logging in but he noted that he has sent his CPAP back in and he is unable to continue using it.. he says it caused him too much discomfort and altering his sleep... he is asking to hold off on another visit at this time.   I will send to Dr Mayford Knife for her review.

## 2023-08-04 NOTE — Telephone Encounter (Signed)
 Notified patient that he may qualify for [employer] device. All questions were answered and patient verbalized understanding.

## 2023-08-25 NOTE — Telephone Encounter (Signed)
 Spoke with pt regarding Inspire Device referral. Pt had not received a call to make an appointment even though he was told a referral for ENT was placed. I placed a new referral order for Dr. Jenne Pane (ENT) and made the pt aware of the referral. Pt verbalized understanding. All questions, if any, were answered.

## 2023-08-25 NOTE — Addendum Note (Signed)
 Addended by: Erick Alley on: 08/25/2023 01:44 PM   Modules accepted: Orders

## 2023-09-13 ENCOUNTER — Other Ambulatory Visit: Payer: Self-pay | Admitting: Cardiology

## 2023-09-14 NOTE — Telephone Encounter (Signed)
 Prescription sent to pharmacy.

## 2023-09-17 ENCOUNTER — Telehealth: Payer: Self-pay | Admitting: Cardiology

## 2023-09-17 NOTE — Telephone Encounter (Signed)
 Shayla with [facility] says their office received a referral for patient with no DOB. MRN was included, but they don't use Epic. She says they need a demographic sheet + any insurance information on file.  Phone#: [phone] Fax#: [phone]

## 2023-09-17 NOTE — Addendum Note (Signed)
 Addended by: CHAUVIGNE, Phyliss Hulick on: 09/17/2023 03:32 PM   Modules accepted: Orders

## 2023-09-17 NOTE — Telephone Encounter (Signed)
 Referral has been faxed.

## 2023-09-30 DIAGNOSIS — F411 Generalized anxiety disorder: Secondary | ICD-10-CM | POA: Diagnosis not present

## 2023-09-30 DIAGNOSIS — Z6823 Body mass index (BMI) 23.0-23.9, adult: Secondary | ICD-10-CM | POA: Diagnosis not present

## 2023-10-09 ENCOUNTER — Other Ambulatory Visit: Payer: Self-pay | Admitting: Cardiology

## 2023-10-15 DIAGNOSIS — N400 Enlarged prostate without lower urinary tract symptoms: Secondary | ICD-10-CM | POA: Diagnosis not present

## 2023-10-15 DIAGNOSIS — R001 Bradycardia, unspecified: Secondary | ICD-10-CM | POA: Diagnosis not present

## 2023-10-15 DIAGNOSIS — N183 Chronic kidney disease, stage 3 unspecified: Secondary | ICD-10-CM | POA: Diagnosis not present

## 2023-10-15 DIAGNOSIS — Z9181 History of falling: Secondary | ICD-10-CM | POA: Diagnosis not present

## 2023-10-15 DIAGNOSIS — Z139 Encounter for screening, unspecified: Secondary | ICD-10-CM | POA: Diagnosis not present

## 2023-10-15 DIAGNOSIS — I2721 Secondary pulmonary arterial hypertension: Secondary | ICD-10-CM | POA: Diagnosis not present

## 2023-10-15 DIAGNOSIS — Z6823 Body mass index (BMI) 23.0-23.9, adult: Secondary | ICD-10-CM | POA: Diagnosis not present

## 2023-10-15 DIAGNOSIS — N529 Male erectile dysfunction, unspecified: Secondary | ICD-10-CM | POA: Diagnosis not present

## 2023-10-15 DIAGNOSIS — E785 Hyperlipidemia, unspecified: Secondary | ICD-10-CM | POA: Diagnosis not present

## 2023-11-05 DIAGNOSIS — G4733 Obstructive sleep apnea (adult) (pediatric): Secondary | ICD-10-CM | POA: Diagnosis not present

## 2023-11-17 ENCOUNTER — Other Ambulatory Visit: Payer: Self-pay

## 2023-11-17 MED ORDER — FUROSEMIDE 20 MG PO TABS: 20.0000 mg | ORAL_TABLET | Freq: Every day | ORAL | 0 refills | Status: DC

## 2023-11-20 ENCOUNTER — Encounter: Payer: Self-pay | Admitting: Cardiology

## 2023-12-03 DIAGNOSIS — N400 Enlarged prostate without lower urinary tract symptoms: Secondary | ICD-10-CM | POA: Diagnosis not present

## 2023-12-21 ENCOUNTER — Ambulatory Visit: Admitting: Cardiology

## 2024-01-04 DIAGNOSIS — R7989 Other specified abnormal findings of blood chemistry: Secondary | ICD-10-CM | POA: Diagnosis not present

## 2024-01-04 DIAGNOSIS — R001 Bradycardia, unspecified: Secondary | ICD-10-CM | POA: Diagnosis not present

## 2024-01-04 DIAGNOSIS — R5381 Other malaise: Secondary | ICD-10-CM | POA: Diagnosis not present

## 2024-01-04 DIAGNOSIS — I350 Nonrheumatic aortic (valve) stenosis: Secondary | ICD-10-CM | POA: Diagnosis not present

## 2024-01-04 DIAGNOSIS — I10 Essential (primary) hypertension: Secondary | ICD-10-CM | POA: Diagnosis not present

## 2024-01-04 DIAGNOSIS — R5383 Other fatigue: Secondary | ICD-10-CM | POA: Diagnosis not present

## 2024-01-04 DIAGNOSIS — E039 Hypothyroidism, unspecified: Secondary | ICD-10-CM | POA: Diagnosis not present

## 2024-01-05 DIAGNOSIS — R001 Bradycardia, unspecified: Secondary | ICD-10-CM | POA: Diagnosis not present

## 2024-01-05 DIAGNOSIS — I441 Atrioventricular block, second degree: Secondary | ICD-10-CM | POA: Diagnosis not present

## 2024-01-05 DIAGNOSIS — I350 Nonrheumatic aortic (valve) stenosis: Secondary | ICD-10-CM | POA: Diagnosis not present

## 2024-01-05 DIAGNOSIS — R5383 Other fatigue: Secondary | ICD-10-CM | POA: Diagnosis not present

## 2024-01-05 DIAGNOSIS — I10 Essential (primary) hypertension: Secondary | ICD-10-CM | POA: Diagnosis not present

## 2024-01-05 DIAGNOSIS — R5381 Other malaise: Secondary | ICD-10-CM | POA: Diagnosis not present

## 2024-01-05 DIAGNOSIS — E038 Other specified hypothyroidism: Secondary | ICD-10-CM | POA: Diagnosis not present

## 2024-01-15 DIAGNOSIS — N390 Urinary tract infection, site not specified: Secondary | ICD-10-CM | POA: Diagnosis not present

## 2024-01-15 DIAGNOSIS — Z043 Encounter for examination and observation following other accident: Secondary | ICD-10-CM | POA: Diagnosis not present

## 2024-01-15 DIAGNOSIS — T50904A Poisoning by unspecified drugs, medicaments and biological substances, undetermined, initial encounter: Secondary | ICD-10-CM | POA: Diagnosis not present

## 2024-01-15 DIAGNOSIS — G9341 Metabolic encephalopathy: Secondary | ICD-10-CM | POA: Diagnosis not present

## 2024-01-15 DIAGNOSIS — T424X1A Poisoning by benzodiazepines, accidental (unintentional), initial encounter: Secondary | ICD-10-CM | POA: Diagnosis not present

## 2024-01-15 DIAGNOSIS — N179 Acute kidney failure, unspecified: Secondary | ICD-10-CM | POA: Diagnosis not present

## 2024-01-15 DIAGNOSIS — I129 Hypertensive chronic kidney disease with stage 1 through stage 4 chronic kidney disease, or unspecified chronic kidney disease: Secondary | ICD-10-CM | POA: Diagnosis not present

## 2024-01-15 DIAGNOSIS — R410 Disorientation, unspecified: Secondary | ICD-10-CM | POA: Diagnosis not present

## 2024-01-15 DIAGNOSIS — N189 Chronic kidney disease, unspecified: Secondary | ICD-10-CM | POA: Diagnosis not present

## 2024-01-15 DIAGNOSIS — R001 Bradycardia, unspecified: Secondary | ICD-10-CM | POA: Diagnosis not present

## 2024-01-15 DIAGNOSIS — B9689 Other specified bacterial agents as the cause of diseases classified elsewhere: Secondary | ICD-10-CM | POA: Diagnosis not present

## 2024-01-15 DIAGNOSIS — W19XXXA Unspecified fall, initial encounter: Secondary | ICD-10-CM | POA: Diagnosis not present

## 2024-01-15 DIAGNOSIS — I10 Essential (primary) hypertension: Secondary | ICD-10-CM | POA: Diagnosis not present

## 2024-01-15 DIAGNOSIS — T887XXA Unspecified adverse effect of drug or medicament, initial encounter: Secondary | ICD-10-CM | POA: Diagnosis not present

## 2024-01-16 DIAGNOSIS — I129 Hypertensive chronic kidney disease with stage 1 through stage 4 chronic kidney disease, or unspecified chronic kidney disease: Secondary | ICD-10-CM | POA: Diagnosis not present

## 2024-01-16 DIAGNOSIS — B9689 Other specified bacterial agents as the cause of diseases classified elsewhere: Secondary | ICD-10-CM | POA: Diagnosis not present

## 2024-01-16 DIAGNOSIS — N390 Urinary tract infection, site not specified: Secondary | ICD-10-CM | POA: Diagnosis not present

## 2024-01-16 DIAGNOSIS — I361 Nonrheumatic tricuspid (valve) insufficiency: Secondary | ICD-10-CM | POA: Diagnosis not present

## 2024-01-16 DIAGNOSIS — E039 Hypothyroidism, unspecified: Secondary | ICD-10-CM | POA: Diagnosis not present

## 2024-01-16 DIAGNOSIS — G9341 Metabolic encephalopathy: Secondary | ICD-10-CM | POA: Diagnosis not present

## 2024-01-16 DIAGNOSIS — N189 Chronic kidney disease, unspecified: Secondary | ICD-10-CM | POA: Diagnosis not present

## 2024-01-16 DIAGNOSIS — G928 Other toxic encephalopathy: Secondary | ICD-10-CM | POA: Diagnosis not present

## 2024-01-16 DIAGNOSIS — I350 Nonrheumatic aortic (valve) stenosis: Secondary | ICD-10-CM | POA: Diagnosis not present

## 2024-01-16 DIAGNOSIS — N179 Acute kidney failure, unspecified: Secondary | ICD-10-CM | POA: Diagnosis not present

## 2024-01-16 DIAGNOSIS — T424X1A Poisoning by benzodiazepines, accidental (unintentional), initial encounter: Secondary | ICD-10-CM | POA: Diagnosis not present

## 2024-01-16 DIAGNOSIS — I272 Pulmonary hypertension, unspecified: Secondary | ICD-10-CM | POA: Diagnosis not present

## 2024-01-16 DIAGNOSIS — I10 Essential (primary) hypertension: Secondary | ICD-10-CM | POA: Diagnosis not present

## 2024-01-16 DIAGNOSIS — R001 Bradycardia, unspecified: Secondary | ICD-10-CM | POA: Diagnosis not present

## 2024-01-17 DIAGNOSIS — I361 Nonrheumatic tricuspid (valve) insufficiency: Secondary | ICD-10-CM | POA: Diagnosis not present

## 2024-01-17 DIAGNOSIS — I350 Nonrheumatic aortic (valve) stenosis: Secondary | ICD-10-CM | POA: Diagnosis not present

## 2024-01-17 DIAGNOSIS — G928 Other toxic encephalopathy: Secondary | ICD-10-CM | POA: Diagnosis not present

## 2024-01-17 DIAGNOSIS — I272 Pulmonary hypertension, unspecified: Secondary | ICD-10-CM | POA: Diagnosis not present

## 2024-01-17 DIAGNOSIS — I10 Essential (primary) hypertension: Secondary | ICD-10-CM | POA: Diagnosis not present

## 2024-01-17 DIAGNOSIS — R001 Bradycardia, unspecified: Secondary | ICD-10-CM | POA: Diagnosis not present

## 2024-01-17 DIAGNOSIS — T424X1A Poisoning by benzodiazepines, accidental (unintentional), initial encounter: Secondary | ICD-10-CM | POA: Diagnosis not present

## 2024-01-17 DIAGNOSIS — N179 Acute kidney failure, unspecified: Secondary | ICD-10-CM | POA: Diagnosis not present

## 2024-01-18 DIAGNOSIS — N179 Acute kidney failure, unspecified: Secondary | ICD-10-CM | POA: Diagnosis not present

## 2024-01-18 DIAGNOSIS — G928 Other toxic encephalopathy: Secondary | ICD-10-CM | POA: Diagnosis not present

## 2024-01-18 DIAGNOSIS — T424X1A Poisoning by benzodiazepines, accidental (unintentional), initial encounter: Secondary | ICD-10-CM | POA: Diagnosis not present

## 2024-01-20 ENCOUNTER — Encounter: Payer: Self-pay | Admitting: Internal Medicine

## 2024-01-20 DIAGNOSIS — Z09 Encounter for follow-up examination after completed treatment for conditions other than malignant neoplasm: Secondary | ICD-10-CM | POA: Diagnosis not present

## 2024-01-20 DIAGNOSIS — N400 Enlarged prostate without lower urinary tract symptoms: Secondary | ICD-10-CM | POA: Diagnosis not present

## 2024-01-20 DIAGNOSIS — R001 Bradycardia, unspecified: Secondary | ICD-10-CM | POA: Diagnosis not present

## 2024-01-20 DIAGNOSIS — G2581 Restless legs syndrome: Secondary | ICD-10-CM | POA: Diagnosis not present

## 2024-01-20 DIAGNOSIS — I2721 Secondary pulmonary arterial hypertension: Secondary | ICD-10-CM | POA: Diagnosis not present

## 2024-01-20 DIAGNOSIS — Z79899 Other long term (current) drug therapy: Secondary | ICD-10-CM | POA: Diagnosis not present

## 2024-01-20 DIAGNOSIS — F411 Generalized anxiety disorder: Secondary | ICD-10-CM | POA: Diagnosis not present

## 2024-01-20 DIAGNOSIS — Z6823 Body mass index (BMI) 23.0-23.9, adult: Secondary | ICD-10-CM | POA: Diagnosis not present

## 2024-01-20 DIAGNOSIS — N183 Chronic kidney disease, stage 3 unspecified: Secondary | ICD-10-CM | POA: Diagnosis not present

## 2024-01-21 ENCOUNTER — Encounter: Payer: Self-pay | Admitting: Internal Medicine

## 2024-01-25 ENCOUNTER — Ambulatory Visit: Admitting: Cardiology

## 2024-01-25 DIAGNOSIS — I77810 Thoracic aortic ectasia: Secondary | ICD-10-CM | POA: Diagnosis not present

## 2024-01-25 DIAGNOSIS — I082 Rheumatic disorders of both aortic and tricuspid valves: Secondary | ICD-10-CM | POA: Diagnosis not present

## 2024-01-25 DIAGNOSIS — I272 Pulmonary hypertension, unspecified: Secondary | ICD-10-CM | POA: Diagnosis not present

## 2024-01-28 DIAGNOSIS — Z79890 Hormone replacement therapy (postmenopausal): Secondary | ICD-10-CM | POA: Diagnosis not present

## 2024-01-28 DIAGNOSIS — E039 Hypothyroidism, unspecified: Secondary | ICD-10-CM | POA: Diagnosis not present

## 2024-01-28 DIAGNOSIS — R262 Difficulty in walking, not elsewhere classified: Secondary | ICD-10-CM | POA: Diagnosis not present

## 2024-01-28 DIAGNOSIS — N183 Chronic kidney disease, stage 3 unspecified: Secondary | ICD-10-CM | POA: Diagnosis not present

## 2024-01-28 DIAGNOSIS — I129 Hypertensive chronic kidney disease with stage 1 through stage 4 chronic kidney disease, or unspecified chronic kidney disease: Secondary | ICD-10-CM | POA: Diagnosis not present

## 2024-01-28 DIAGNOSIS — Z950 Presence of cardiac pacemaker: Secondary | ICD-10-CM | POA: Diagnosis not present

## 2024-01-28 DIAGNOSIS — E611 Iron deficiency: Secondary | ICD-10-CM | POA: Diagnosis not present

## 2024-01-28 DIAGNOSIS — R001 Bradycardia, unspecified: Secondary | ICD-10-CM | POA: Diagnosis not present

## 2024-01-28 DIAGNOSIS — Z79899 Other long term (current) drug therapy: Secondary | ICD-10-CM | POA: Diagnosis not present

## 2024-01-28 DIAGNOSIS — I10 Essential (primary) hypertension: Secondary | ICD-10-CM | POA: Diagnosis not present

## 2024-01-28 DIAGNOSIS — I441 Atrioventricular block, second degree: Secondary | ICD-10-CM | POA: Diagnosis not present

## 2024-01-29 DIAGNOSIS — Z79899 Other long term (current) drug therapy: Secondary | ICD-10-CM | POA: Diagnosis not present

## 2024-01-29 DIAGNOSIS — J9811 Atelectasis: Secondary | ICD-10-CM | POA: Diagnosis not present

## 2024-01-29 DIAGNOSIS — I441 Atrioventricular block, second degree: Secondary | ICD-10-CM | POA: Diagnosis not present

## 2024-01-29 DIAGNOSIS — R001 Bradycardia, unspecified: Secondary | ICD-10-CM | POA: Diagnosis not present

## 2024-01-29 DIAGNOSIS — E039 Hypothyroidism, unspecified: Secondary | ICD-10-CM | POA: Diagnosis not present

## 2024-01-29 DIAGNOSIS — Z79890 Hormone replacement therapy (postmenopausal): Secondary | ICD-10-CM | POA: Diagnosis not present

## 2024-01-29 DIAGNOSIS — R262 Difficulty in walking, not elsewhere classified: Secondary | ICD-10-CM | POA: Diagnosis not present

## 2024-01-29 DIAGNOSIS — E611 Iron deficiency: Secondary | ICD-10-CM | POA: Diagnosis not present

## 2024-01-29 DIAGNOSIS — I499 Cardiac arrhythmia, unspecified: Secondary | ICD-10-CM | POA: Diagnosis not present

## 2024-01-29 DIAGNOSIS — N183 Chronic kidney disease, stage 3 unspecified: Secondary | ICD-10-CM | POA: Diagnosis not present

## 2024-01-29 DIAGNOSIS — Z950 Presence of cardiac pacemaker: Secondary | ICD-10-CM | POA: Diagnosis not present

## 2024-01-29 DIAGNOSIS — I129 Hypertensive chronic kidney disease with stage 1 through stage 4 chronic kidney disease, or unspecified chronic kidney disease: Secondary | ICD-10-CM | POA: Diagnosis not present

## 2024-02-08 DIAGNOSIS — I441 Atrioventricular block, second degree: Secondary | ICD-10-CM | POA: Diagnosis not present

## 2024-02-08 DIAGNOSIS — I272 Pulmonary hypertension, unspecified: Secondary | ICD-10-CM | POA: Diagnosis not present

## 2024-02-08 DIAGNOSIS — I350 Nonrheumatic aortic (valve) stenosis: Secondary | ICD-10-CM | POA: Diagnosis not present

## 2024-02-08 DIAGNOSIS — R001 Bradycardia, unspecified: Secondary | ICD-10-CM | POA: Diagnosis not present

## 2024-02-08 DIAGNOSIS — R5383 Other fatigue: Secondary | ICD-10-CM | POA: Diagnosis not present

## 2024-02-08 DIAGNOSIS — G4733 Obstructive sleep apnea (adult) (pediatric): Secondary | ICD-10-CM | POA: Diagnosis not present

## 2024-02-08 DIAGNOSIS — I10 Essential (primary) hypertension: Secondary | ICD-10-CM | POA: Diagnosis not present

## 2024-02-08 DIAGNOSIS — Z950 Presence of cardiac pacemaker: Secondary | ICD-10-CM | POA: Diagnosis not present

## 2024-02-10 DIAGNOSIS — R001 Bradycardia, unspecified: Secondary | ICD-10-CM | POA: Diagnosis not present

## 2024-02-10 DIAGNOSIS — F411 Generalized anxiety disorder: Secondary | ICD-10-CM | POA: Diagnosis not present

## 2024-02-10 DIAGNOSIS — N183 Chronic kidney disease, stage 3 unspecified: Secondary | ICD-10-CM | POA: Diagnosis not present

## 2024-02-10 DIAGNOSIS — Z6823 Body mass index (BMI) 23.0-23.9, adult: Secondary | ICD-10-CM | POA: Diagnosis not present

## 2024-02-10 DIAGNOSIS — I2721 Secondary pulmonary arterial hypertension: Secondary | ICD-10-CM | POA: Diagnosis not present

## 2024-03-10 DIAGNOSIS — Z950 Presence of cardiac pacemaker: Secondary | ICD-10-CM | POA: Diagnosis not present

## 2024-05-12 DIAGNOSIS — G4733 Obstructive sleep apnea (adult) (pediatric): Secondary | ICD-10-CM | POA: Diagnosis not present

## 2024-05-12 DIAGNOSIS — I441 Atrioventricular block, second degree: Secondary | ICD-10-CM | POA: Diagnosis not present

## 2024-05-12 DIAGNOSIS — R001 Bradycardia, unspecified: Secondary | ICD-10-CM | POA: Diagnosis not present

## 2024-05-12 DIAGNOSIS — Z950 Presence of cardiac pacemaker: Secondary | ICD-10-CM | POA: Diagnosis not present

## 2024-05-31 ENCOUNTER — Other Ambulatory Visit: Payer: Self-pay | Admitting: Cardiology
